# Patient Record
Sex: Male | Born: 1945
Health system: Southern US, Community
[De-identification: ages and names within clinical notes are randomized; demographics above are authoritative.]

## PROBLEM LIST (undated history)

## (undated) DIAGNOSIS — C801 Malignant (primary) neoplasm, unspecified: Secondary | ICD-10-CM

## (undated) DIAGNOSIS — T7840XA Allergy, unspecified, initial encounter: Secondary | ICD-10-CM

## (undated) DIAGNOSIS — R011 Cardiac murmur, unspecified: Secondary | ICD-10-CM

## (undated) DIAGNOSIS — H269 Unspecified cataract: Secondary | ICD-10-CM

## (undated) DIAGNOSIS — I00 Rheumatic fever without heart involvement: Secondary | ICD-10-CM

## (undated) DIAGNOSIS — Z22322 Carrier or suspected carrier of Methicillin resistant Staphylococcus aureus: Secondary | ICD-10-CM

## (undated) DIAGNOSIS — I10 Essential (primary) hypertension: Secondary | ICD-10-CM

## (undated) DIAGNOSIS — C4491 Basal cell carcinoma of skin, unspecified: Secondary | ICD-10-CM

## (undated) HISTORY — DX: Allergy, unspecified, initial encounter: T78.40XA

## (undated) HISTORY — DX: Essential (primary) hypertension: I10

## (undated) HISTORY — DX: Malignant (primary) neoplasm, unspecified: C80.1

## (undated) HISTORY — PX: COLONOSCOPY: SHX174

## (undated) HISTORY — DX: Basal cell carcinoma of skin, unspecified: C44.91

## (undated) HISTORY — DX: Unspecified cataract: H26.9

## (undated) HISTORY — PX: HERNIA REPAIR: SHX51

## (undated) HISTORY — DX: Cardiac murmur, unspecified: R01.1

## (undated) HISTORY — DX: Rheumatic fever without heart involvement: I00

## (undated) HISTORY — DX: Carrier or suspected carrier of methicillin resistant Staphylococcus aureus: Z22.322

## (undated) HISTORY — PX: SKIN CANCER EXCISION: SHX779

---

## 2005-03-20 DIAGNOSIS — Z22322 Carrier or suspected carrier of Methicillin resistant Staphylococcus aureus: Secondary | ICD-10-CM

## 2005-03-20 HISTORY — DX: Carrier or suspected carrier of methicillin resistant Staphylococcus aureus: Z22.322

## 2011-12-31 HISTORY — PX: ABDOMINAL HERNIA REPAIR: SHX539

## 2013-04-14 DIAGNOSIS — J3489 Other specified disorders of nose and nasal sinuses: Secondary | ICD-10-CM | POA: Diagnosis not present

## 2013-04-14 DIAGNOSIS — J309 Allergic rhinitis, unspecified: Secondary | ICD-10-CM | POA: Diagnosis not present

## 2013-04-24 DIAGNOSIS — J309 Allergic rhinitis, unspecified: Secondary | ICD-10-CM | POA: Diagnosis not present

## 2013-05-01 DIAGNOSIS — J3489 Other specified disorders of nose and nasal sinuses: Secondary | ICD-10-CM | POA: Diagnosis not present

## 2013-05-01 DIAGNOSIS — J309 Allergic rhinitis, unspecified: Secondary | ICD-10-CM | POA: Diagnosis not present

## 2013-05-16 DIAGNOSIS — J3089 Other allergic rhinitis: Secondary | ICD-10-CM | POA: Diagnosis not present

## 2013-05-16 DIAGNOSIS — J3081 Allergic rhinitis due to animal (cat) (dog) hair and dander: Secondary | ICD-10-CM | POA: Diagnosis not present

## 2013-05-16 DIAGNOSIS — J301 Allergic rhinitis due to pollen: Secondary | ICD-10-CM | POA: Diagnosis not present

## 2013-05-27 DIAGNOSIS — J301 Allergic rhinitis due to pollen: Secondary | ICD-10-CM | POA: Diagnosis not present

## 2013-05-27 DIAGNOSIS — J3089 Other allergic rhinitis: Secondary | ICD-10-CM | POA: Diagnosis not present

## 2013-05-27 DIAGNOSIS — J3081 Allergic rhinitis due to animal (cat) (dog) hair and dander: Secondary | ICD-10-CM | POA: Diagnosis not present

## 2013-06-03 DIAGNOSIS — J3081 Allergic rhinitis due to animal (cat) (dog) hair and dander: Secondary | ICD-10-CM | POA: Diagnosis not present

## 2013-06-03 DIAGNOSIS — J3089 Other allergic rhinitis: Secondary | ICD-10-CM | POA: Diagnosis not present

## 2013-06-03 DIAGNOSIS — J301 Allergic rhinitis due to pollen: Secondary | ICD-10-CM | POA: Diagnosis not present

## 2013-06-10 DIAGNOSIS — J301 Allergic rhinitis due to pollen: Secondary | ICD-10-CM | POA: Diagnosis not present

## 2013-06-10 DIAGNOSIS — J3081 Allergic rhinitis due to animal (cat) (dog) hair and dander: Secondary | ICD-10-CM | POA: Diagnosis not present

## 2013-06-10 DIAGNOSIS — J3089 Other allergic rhinitis: Secondary | ICD-10-CM | POA: Diagnosis not present

## 2013-06-17 DIAGNOSIS — J301 Allergic rhinitis due to pollen: Secondary | ICD-10-CM | POA: Diagnosis not present

## 2013-06-17 DIAGNOSIS — J3081 Allergic rhinitis due to animal (cat) (dog) hair and dander: Secondary | ICD-10-CM | POA: Diagnosis not present

## 2013-06-17 DIAGNOSIS — J3089 Other allergic rhinitis: Secondary | ICD-10-CM | POA: Diagnosis not present

## 2013-12-01 DIAGNOSIS — J309 Allergic rhinitis, unspecified: Secondary | ICD-10-CM | POA: Diagnosis not present

## 2013-12-23 DIAGNOSIS — J3081 Allergic rhinitis due to animal (cat) (dog) hair and dander: Secondary | ICD-10-CM | POA: Diagnosis not present

## 2013-12-23 DIAGNOSIS — J301 Allergic rhinitis due to pollen: Secondary | ICD-10-CM | POA: Diagnosis not present

## 2013-12-29 ENCOUNTER — Ambulatory Visit (INDEPENDENT_AMBULATORY_CARE_PROVIDER_SITE_OTHER): Payer: Medicare Other | Admitting: Family

## 2013-12-29 ENCOUNTER — Encounter: Payer: Self-pay | Admitting: Family

## 2013-12-29 VITALS — BP 136/84 | HR 80 | Temp 98.2°F | Resp 16 | Ht 70.0 in | Wt 171.8 lb

## 2013-12-29 DIAGNOSIS — Z23 Encounter for immunization: Secondary | ICD-10-CM

## 2013-12-29 DIAGNOSIS — I10 Essential (primary) hypertension: Secondary | ICD-10-CM | POA: Diagnosis not present

## 2013-12-29 DIAGNOSIS — Z8619 Personal history of other infectious and parasitic diseases: Secondary | ICD-10-CM

## 2013-12-29 DIAGNOSIS — Z91048 Other nonmedicinal substance allergy status: Secondary | ICD-10-CM | POA: Diagnosis not present

## 2013-12-29 DIAGNOSIS — Z8679 Personal history of other diseases of the circulatory system: Secondary | ICD-10-CM

## 2013-12-29 DIAGNOSIS — Z9109 Other allergy status, other than to drugs and biological substances: Secondary | ICD-10-CM

## 2013-12-29 DIAGNOSIS — J301 Allergic rhinitis due to pollen: Secondary | ICD-10-CM | POA: Diagnosis not present

## 2013-12-29 DIAGNOSIS — J3089 Other allergic rhinitis: Secondary | ICD-10-CM | POA: Diagnosis not present

## 2013-12-29 DIAGNOSIS — J3081 Allergic rhinitis due to animal (cat) (dog) hair and dander: Secondary | ICD-10-CM | POA: Diagnosis not present

## 2013-12-29 MED ORDER — LISINOPRIL 10 MG PO TABS: 10.0000 mg | ORAL_TABLET | Freq: Every day | ORAL | Status: DC

## 2013-12-29 NOTE — Patient Instructions (Signed)
Please complete your lab work prior to leaving. Schedule a medicare wellness visit at the front desk. Welcome to [facility]!

## 2013-12-29 NOTE — Progress Notes (Signed)
Pre visit review using our clinic review tool, if applicable. No additional management support is needed unless otherwise documented below in the visit note. 

## 2013-12-29 NOTE — Progress Notes (Signed)
   Subjective:    Patient ID: Gregory Brennan, male    DOB: 12-28-1945, 68 y.o.   MRN: [id number]  HPI  Gregory Brennan is a 68 yr old male who presents today to establish care.  HTN- Maintained on lisinopril. Reports that he started on BP meds in 08/2005.    Rheumatic fever as a child- reports that he had at age 61 1/4.  Reports that he had a murmur as a child, but not as an adult.  Denies SOB or swelling. Was an athlete through school, bikes/walks regularly.    Allergies- reports that his allergies were year round in [place]. Started to see an allergist when he was in [place].  He is established with an allergist- Dr. Donneta Romberg.   Review of Systems  Constitutional: Negative for unexpected weight change.  HENT: Positive for rhinorrhea.   Respiratory: Negative for cough and shortness of breath.   Cardiovascular: Negative for chest pain and leg swelling.  Gastrointestinal: Negative for nausea, diarrhea and constipation.  Musculoskeletal:       Occasional knee pain  Neurological: Negative for headaches.  Hematological: Negative for adenopathy.  Psychiatric/Behavioral:       Denies depression/anxiety       Past Medical History  Diagnosis Date  . Allergy   . Hypertension     History   Social History  . Marital Status: Married    Spouse Name: N/A    Number of Children: N/A  . Years of Education: N/A   Occupational History  . Not on file.   Social History Main Topics  . Smoking status: Never Smoker   . Smokeless tobacco: Never Used  . Alcohol Use: No  . Drug Use: Not on file  . Sexual Activity: Not on file   Other Topics Concern  . 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 Abdominal hernia repair       Family History  Problem Relation Age of Onset  . Emphysema Mother   . Hypertension Father     Allergies  Allergen Reactions  . Penicillins Rash    No current outpatient prescriptions on file prior to visit.   No current facility-administered medications on file prior to visit.    BP 136/84  Pulse 80  Temp(Src) 98.2 F (36.8 C) (Oral)  Resp 16  Ht 5\' 10"  (1.778 m)  Wt 171 lb 12.8 oz (77.928 kg)  BMI 24.65 kg/m2  SpO2 100%    Objective:   Physical Exam  Constitutional: He is oriented to person, place, and time. He appears well-developed and well-nourished. No distress.  HENT:  Head: Normocephalic and atraumatic.  Cardiovascular: Normal rate and regular rhythm.   No murmur heard. Pulmonary/Chest: Effort normal and breath sounds normal. No respiratory distress. He has no wheezes. He has no rales. He exhibits no tenderness.  Neurological: He is alert and oriented to person, place, and time.  Skin: Skin is warm and dry.  Psychiatric: He has a normal mood and affect. His behavior is normal. Judgment and thought content normal.          Assessment & Plan:

## 2013-12-30 ENCOUNTER — Telehealth: Payer: Self-pay | Admitting: Family

## 2013-12-30 DIAGNOSIS — Z9109 Other allergy status, other than to drugs and biological substances: Secondary | ICD-10-CM | POA: Insufficient documentation

## 2013-12-30 DIAGNOSIS — Z8679 Personal history of other diseases of the circulatory system: Secondary | ICD-10-CM | POA: Insufficient documentation

## 2013-12-30 DIAGNOSIS — E871 Hypo-osmolality and hyponatremia: Secondary | ICD-10-CM

## 2013-12-30 DIAGNOSIS — I10 Essential (primary) hypertension: Secondary | ICD-10-CM | POA: Insufficient documentation

## 2013-12-30 LAB — BASIC METABOLIC PANEL
BUN: 9 mg/dL (ref 6–23)
CO2: 27 meq/L (ref 19–32)
CREATININE: 0.9 mg/dL (ref 0.4–1.5)
Calcium: 8.9 mg/dL (ref 8.4–10.5)
Chloride: 95 mEq/L — ABNORMAL LOW (ref 96–112)
GFR: 86.26 mL/min (ref >60.00)
Glucose, Bld: 108 mg/dL — ABNORMAL HIGH (ref 70–99)
Potassium: 3.6 mEq/L (ref 3.5–5.1)
Sodium: 130 mEq/L — ABNORMAL LOW (ref 135–145)

## 2013-12-30 NOTE — Assessment & Plan Note (Signed)
He is being treated by allergist- Dr. Donneta Romberg

## 2013-12-30 NOTE — Telephone Encounter (Signed)
Notified pt and scheduled lab appt for 01/07/14 at 11am.  Future orders signed.

## 2013-12-30 NOTE — Assessment & Plan Note (Signed)
No murmur, no cardiac complaints.

## 2013-12-30 NOTE — Telephone Encounter (Signed)
Sodium is low, and chloride is low. Please return to lab in 1 week so we can repeat and check some other pertinent labs.

## 2013-12-30 NOTE — Assessment & Plan Note (Signed)
BP Readings from Last 3 Encounters:  12/29/13 136/84   BP stable on lisinopril, obtain follow up bmet.

## 2014-01-06 DIAGNOSIS — J301 Allergic rhinitis due to pollen: Secondary | ICD-10-CM | POA: Diagnosis not present

## 2014-01-06 DIAGNOSIS — J3089 Other allergic rhinitis: Secondary | ICD-10-CM | POA: Diagnosis not present

## 2014-01-06 DIAGNOSIS — J3081 Allergic rhinitis due to animal (cat) (dog) hair and dander: Secondary | ICD-10-CM | POA: Diagnosis not present

## 2014-01-07 ENCOUNTER — Other Ambulatory Visit (INDEPENDENT_AMBULATORY_CARE_PROVIDER_SITE_OTHER): Payer: Medicare Other

## 2014-01-07 DIAGNOSIS — E871 Hypo-osmolality and hyponatremia: Secondary | ICD-10-CM | POA: Diagnosis not present

## 2014-01-07 LAB — BASIC METABOLIC PANEL
BUN: 9 mg/dL (ref 6–23)
CALCIUM: 8.8 mg/dL (ref 8.4–10.5)
CO2: 30 mEq/L (ref 19–32)
CREATININE: 1 mg/dL (ref 0.4–1.5)
Chloride: 97 mEq/L (ref 96–112)
GFR: 76.66 mL/min (ref >60.00)
Glucose, Bld: 97 mg/dL (ref 70–99)
Potassium: 3.8 mEq/L (ref 3.5–5.1)
Sodium: 129 mEq/L — ABNORMAL LOW (ref 135–145)

## 2014-01-08 ENCOUNTER — Telehealth: Payer: Self-pay | Admitting: Family

## 2014-01-08 DIAGNOSIS — E871 Hypo-osmolality and hyponatremia: Secondary | ICD-10-CM

## 2014-01-08 LAB — OSMOLALITY, URINE: Osmolality, Ur: 196 mOsm/kg — ABNORMAL LOW (ref 390–1090)

## 2014-01-08 LAB — OSMOLALITY: Osmolality: 276 mOsm/kg (ref 275–300)

## 2014-01-08 LAB — SODIUM, URINE, RANDOM: SODIUM UR: 41 meq/L

## 2014-01-08 NOTE — Telephone Encounter (Signed)
Please let pt know that I reviewed his lab work.  His sodium is still low.  How many ounces of water/fluid does he drink a day?  I would like to refer him to see a specialist to further evaluate his low sodium.

## 2014-01-09 NOTE — Telephone Encounter (Signed)
Left message for pt to call with response to below recommendations.

## 2014-01-09 NOTE — Telephone Encounter (Signed)
Spoke with pt. He states he drinks 64oz of water and 24-48 oz of coffee daily. Total 88-110oz fluid daily.  Please advise.

## 2014-01-10 NOTE — Telephone Encounter (Signed)
i would recommend that he bring total fluids down 64 oz a day.

## 2014-01-12 NOTE — Telephone Encounter (Signed)
Notified pt and he voices understanding. Referral signed.

## 2014-01-14 DIAGNOSIS — J3089 Other allergic rhinitis: Secondary | ICD-10-CM | POA: Diagnosis not present

## 2014-01-14 DIAGNOSIS — J3081 Allergic rhinitis due to animal (cat) (dog) hair and dander: Secondary | ICD-10-CM | POA: Diagnosis not present

## 2014-01-14 DIAGNOSIS — J301 Allergic rhinitis due to pollen: Secondary | ICD-10-CM | POA: Diagnosis not present

## 2014-01-16 ENCOUNTER — Encounter: Payer: Self-pay | Admitting: Endocrinology

## 2014-01-16 ENCOUNTER — Ambulatory Visit (INDEPENDENT_AMBULATORY_CARE_PROVIDER_SITE_OTHER): Payer: Medicare Other | Admitting: Endocrinology

## 2014-01-16 VITALS — BP 154/86 | HR 84 | Temp 98.0°F | Resp 14 | Ht 70.0 in | Wt 171.8 lb

## 2014-01-16 DIAGNOSIS — E871 Hypo-osmolality and hyponatremia: Secondary | ICD-10-CM

## 2014-01-16 LAB — COMPREHENSIVE METABOLIC PANEL
ALT: 24 U/L (ref 0–53)
AST: 21 U/L (ref 0–37)
Albumin: 3.9 g/dL (ref 3.5–5.2)
Alkaline Phosphatase: 85 U/L (ref 39–117)
BILIRUBIN TOTAL: 0.7 mg/dL (ref 0.2–1.2)
BUN: 13 mg/dL (ref 6–23)
CO2: 30 mEq/L (ref 19–32)
CREATININE: 1.2 mg/dL (ref 0.4–1.5)
Calcium: 9.1 mg/dL (ref 8.4–10.5)
Chloride: 96 mEq/L (ref 96–112)
GFR: 67.5 mL/min (ref >60.00)
Glucose, Bld: 101 mg/dL — ABNORMAL HIGH (ref 70–99)
Potassium: 3.9 mEq/L (ref 3.5–5.1)
Sodium: 133 mEq/L — ABNORMAL LOW (ref 135–145)
Total Protein: 7.2 g/dL (ref 6.0–8.3)

## 2014-01-16 LAB — T4, FREE: FREE T4: 0.93 ng/dL (ref 0.60–1.60)

## 2014-01-16 LAB — TSH: TSH: 0.6 u[IU]/mL (ref 0.35–4.50)

## 2014-01-16 NOTE — Progress Notes (Signed)
Patient ID: Gregory Brennan, male   DOB: 05/21/1945, 68 y.o.   MRN: [id number]   Chief complaint: Low sodium  History of Present Illness:  He had a new patient visit with his PCP on 12/29/13 and because of his hypertension he had a basic metabolic panel checked He was found to have a sodium of 130 and on repeat testing it was 129 He does not think that his primary care physician in [place] where he moved from had mentioned low sodium before on his lab testing. However no records are available  Recently has had no symptoms of fatigue, malaise, nausea, decreased appetite or any episodes of diarrhea or acute illness No recent weight change  According to the notes from his PCP he had been drinking between 88-110 ounces of fluid in a day He has been doing this for quite some time, probably 2-3 years This is partly because of his working in a hot environment before he moved here Also he was told by his friends that he needed to be drinking a lot of water, he is somewhat concerned about wanting to lose weight Today he states that he has been drinking 4-6 ounces of water bottles per day as also about 4-6 cups of coffee  After discussion with his obesity about 4 days ago he has started cutting back on his fluids as advised. He has been told to limit his total fluid intake in a day to 8 cups of fluids and has been trying to do this    Past Medical History  Diagnosis Date  . Allergy   . Hypertension     Past Surgical History  Procedure Laterality Date  . Abdominal hernia repair      Family History  Problem Relation Age of Onset  . Emphysema Mother   . Hypertension Father     Social History:  reports that he has never smoked. He has never used smokeless tobacco. He reports that he does not drink alcohol. His drug history is not on file.  Allergies:  Allergies  Allergen Reactions  . Penicillins Rash      Medication List       This list is accurate as of: 01/16/14  2:30 PM.   Always use your most recent med list.               lisinopril 10 MG tablet  Commonly known as:  PRINIVIL,ZESTRIL  Take 1 tablet (10 mg total) by mouth daily.       Review of Systems  Constitutional: Negative for weight loss and malaise/fatigue.  HENT: Negative for congestion.        Only with allergies  Respiratory: Negative for cough and sputum production.   Cardiovascular:       Hypertension treated since 2008  Gastrointestinal: Negative for nausea.  Neurological: Negative for dizziness.  Endo/Heme/Allergies: Positive for polydipsia.       Drinking water and coffee   Chest Xray was last done 2-3 years ago       No unusual headaches.      Thyroid:  No cold or heat intolerance, unusual fatigue     No swelling of feet.     No abdominal pain.  Bowel habits:  No change.       No frequency of urination or excessive nocturia  LABS:  No visits with results within 1 Week(s) from this visit. Latest known visit with results is:  Appointment on 01/07/2014  Component Date Value Ref Range Status  .  Osmolality 01/07/2014 276  275 - 300 mOsm/kg Final  . Sodium, Ur 01/07/2014 41   Final  . Osmolality, Ur 01/07/2014 196* 390 - 1090 mOsm/kg Final  . Sodium 01/07/2014 129* 135 - 145 mEq/L Final  . Potassium 01/07/2014 3.8  3.5 - 5.1 mEq/L Final  . Chloride 01/07/2014 97  96 - 112 mEq/L Final  . CO2 01/07/2014 30  19 - 32 mEq/L Final  . Glucose, Bld 01/07/2014 97  70 - 99 mg/dL Final  . BUN 01/07/2014 9  6 - 23 mg/dL Final  . Creatinine, Ser 01/07/2014 1.0  0.4 - 1.5 mg/dL Final  . Calcium 01/07/2014 8.8  8.4 - 10.5 mg/dL Final  . GFR 01/07/2014 76.66  >60.00 mL/min Final    .    PHYSICAL EXAM:  BP 154/86  Pulse 84  Temp(Src) 98 F (36.7 C)  Resp 14  Ht 5\' 10"  (1.778 m)  Wt 171 lb 12.8 oz (77.928 kg)  BMI 24.65 kg/m2  SpO2 98%  GENERAL: average built and nourished, pleasant and cooperative  No pallor, clubbing, lymphadenopathy or edema.  Skin:  no rash or  pigmentation.  EYES:  Externally normal.  Fundii:  normal discs and vessels.  ENT: Oral mucosa and tongue normal.  THYROID:  Not palpable.  HEART:  Normal  S1 and S2; no murmur or click.  CHEST:  Normal shape.  Lungs: percussion normal and bilaterally symmetrical Vescicular breath sounds heard equally.  No crepitations/ wheeze.  ABDOMEN:  No distention.  Liver and spleen not palpable.  No other mass or tenderness.  NEUROLOGICAL: .Reflexes are bilaterally normal at biceps.  JOINTS:  Normal.   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 01/16/2014, 2:30 PM   Addendum: Labs show improving sodium level of 133, thyroid levels normal  He will continue to reduce his fluid intake and follow-up with PCP periodically for repeat sodium  Office Visit on 01/16/2014  Component Date Value Ref Range Status  . Free T4 01/16/2014 0.93  0.60 - 1.60 ng/dL Final  . TSH 01/16/2014 0.60  0.35 - 4.50 uIU/mL Final  . Sodium 01/16/2014 133* 135 - 145 mEq/L Final  . Potassium 01/16/2014 3.9  3.5 - 5.1  mEq/L Final  . Chloride 01/16/2014 96  96 - 112 mEq/L Final  . CO2 01/16/2014 30  19 - 32 mEq/L Final  . Glucose, Bld 01/16/2014 101* 70 - 99 mg/dL Final  . BUN 01/16/2014 13  6 - 23 mg/dL Final  . Creatinine, Ser 01/16/2014 1.2  0.4 - 1.5  mg/dL Final  . Total Bilirubin 01/16/2014 0.7  0.2 - 1.2 mg/dL Final  . Alkaline Phosphatase 01/16/2014 85  39 - 117 U/L Final  . AST 01/16/2014 21  0 - 37 U/L Final  . ALT 01/16/2014 24  0 - 53 U/L Final  . Total Protein 01/16/2014 7.2  6.0 - 8.3 g/dL Final  . Albumin 01/16/2014 3.9  3.5 - 5.2 g/dL Final  . Calcium 01/16/2014 9.1  8.4 - 10.5 mg/dL Final  . GFR 01/16/2014 67.50  >60.00 mL/min Final

## 2014-01-19 NOTE — Progress Notes (Signed)
Quick Note:  Sodium is significantly better, continue reducing fluid intake as discussed, follow-up with primary care, thyroid okay ______

## 2014-01-20 DIAGNOSIS — J3089 Other allergic rhinitis: Secondary | ICD-10-CM | POA: Diagnosis not present

## 2014-01-20 DIAGNOSIS — J3081 Allergic rhinitis due to animal (cat) (dog) hair and dander: Secondary | ICD-10-CM | POA: Diagnosis not present

## 2014-01-20 DIAGNOSIS — J301 Allergic rhinitis due to pollen: Secondary | ICD-10-CM | POA: Diagnosis not present

## 2014-01-26 ENCOUNTER — Encounter: Payer: Self-pay | Admitting: Family

## 2014-01-26 ENCOUNTER — Ambulatory Visit (INDEPENDENT_AMBULATORY_CARE_PROVIDER_SITE_OTHER): Payer: Medicare Other | Admitting: Family

## 2014-01-26 VITALS — BP 150/92 | HR 62 | Temp 97.8°F | Resp 18 | Ht 70.0 in | Wt 173.6 lb

## 2014-01-26 DIAGNOSIS — I10 Essential (primary) hypertension: Secondary | ICD-10-CM | POA: Diagnosis not present

## 2014-01-26 DIAGNOSIS — Z0000 Encounter for general adult medical examination without abnormal findings: Secondary | ICD-10-CM | POA: Diagnosis not present

## 2014-01-26 DIAGNOSIS — Z23 Encounter for immunization: Secondary | ICD-10-CM | POA: Diagnosis not present

## 2014-01-26 MED ORDER — LISINOPRIL 10 MG PO TABS: 20.0000 mg | ORAL_TABLET | Freq: Every day | ORAL | Status: DC

## 2014-01-26 NOTE — Progress Notes (Signed)
Subjective:    Patient ID: Gregory Brennan, male    DOB: [birth year], 68 y.o.   MRN: [id number]  HPI    Review of Systems     Objective:   Physical Exam        Assessment & Plan:   Subjective:    Gregory Brennan is a 68 y.o. male who presents for Medicare Initial preventive examination.  Preventive Screening-Counseling & Management  Tobacco History  Smoking status  . Never Smoker   Smokeless tobacco  . Never Used    Problems Prior to Visit 1. HTN-  BP Readings from Last 3 Encounters:  01/26/14 150/92  01/16/14 154/86  12/29/13 136/84   2. Hyponatremia- Was improved by fluid restriction to 64 oz a day. Saw Dr.Kumar.     Current Problems (verified) Patient Active Problem List   Diagnosis Date Noted  . HTN (hypertension) 12/30/2013  . History of rheumatic fever 12/30/2013  . Environmental allergies 12/30/2013    Medications Prior to Visit Current Outpatient Prescriptions on File Prior to Visit  Medication Sig Dispense Refill  . lisinopril (PRINIVIL,ZESTRIL) 10 MG tablet Take 1 tablet (10 mg total) by mouth daily. 90 tablet 1   No current facility-administered medications on file prior to visit.    Current Medications (verified) Current Outpatient Prescriptions  Medication Sig Dispense Refill  . lisinopril (PRINIVIL,ZESTRIL) 10 MG tablet Take 1 tablet (10 mg total) by mouth daily. 90 tablet 1   No current facility-administered medications for this visit.     Allergies (verified) Penicillins   PAST HISTORY  Family History Family History  Problem Relation Age of Onset  . Emphysema Mother   . Hypertension Father     Social History History  Substance Use Topics  . Smoking status: Never Smoker   . Smokeless tobacco: Never Used  . Alcohol Use: No    Are there smokers in your home (other than you)?   No  Risk Factors Current exercise habits: weight lifting and cardio  Dietary issues discussed: working on healthy diet   Cardiac risk  factors: advanced age (older than 66 for men, 61 for women). HTN  Depression Screen (Note: if answer to either of the following is "Yes", a more complete depression screening is indicated)   Q1: Over the past two weeks, have you felt down, depressed or hopeless? No  Q2: Over the past two weeks, have you felt little interest or pleasure in doing things? No  Have you lost interest or pleasure in daily life? No  Do you often feel hopeless? No  Do you cry easily over simple problems? No  Activities of Daily Living In your present state of health, do you have any difficulty performing the following activities?:  Driving? No Managing money?  No Feeding yourself? No Getting from bed to chair? No Climbing a flight of stairs? No Preparing food and eating?: No Bathing or showering? No Getting dressed: No Getting to the toilet? No Using the toilet:No Moving around from place to place: No In the past year have you fallen or had a near fall?:No   Are you sexually active?  Yes  Do you have more than one partner?  No  Hearing Difficulties: No Do you often ask people to speak up or repeat themselves? No Do you experience ringing or noises in your ears? No Do you have difficulty understanding soft or whispered voices? No   Do you feel that you have a problem with memory? No  Do you often  misplace items? No  Do you feel safe at home?  No  Cognitive Testing  Alert? Yes  Normal Appearance?Yes  Oriented to person? Yes  Place? Yes   Time? Yes  Recall of three objects?  Yes  Can perform simple calculations? Yes  Displays appropriate judgment?Yes  Can read the correct time from a watch face?Yes   Advanced Directives have been discussed with the patient? Yes   List the Names of Other Physician/Practitioners you currently use: 1.    Indicate any recent Medical Services you may have received from other than [facility] providers in the past year (date may be approximate).  Immunization History    Administered Date(s) Administered  . Influenza,inj,Quad PF,36+ Mos 12/29/2013  . Tdap 03/20/2008    Screening Tests Health Maintenance  Topic Date Due  . TETANUS/TDAP  06/14/1966  . ZOSTAVAX  06/14/2007  . PNEUMOCOCCAL POLYSACCHARIDE VACCINE AGE 22 AND OVER  06/13/2012  . INFLUENZA VACCINE  10/19/2014  . COLONOSCOPY  03/21/2019    All answers were reviewed with the patient and necessary referrals were made:  O'SULLIVAN,Nikkie Liming S., NP   01/26/2014   History reviewed: allergies, current medications, past family history, past medical history, past social history, past surgical history and problem list  Review of Systems Pertinent items are noted in HPI.    Objective:     Vision by Snellen chart: right OIN:OMVEHMC declines measurement, left NOB:SJGGEZM declines measurement Blood pressure 150/92, pulse 62, temperature 97.8 F (36.6 C), temperature source Oral, resp. rate 18, height 5\' 10"  (1.778 m), weight 173 lb 9.6 oz (78.744 kg), SpO2 99 %. Body mass index is 24.91 kg/(m^2).  Physical Exam  Constitutional: He is oriented to person, place, and time. He appears well-developed and well-nourished. No distress.  HENT:  Head: Normocephalic and atraumatic.  Right Ear: Tympanic membrane and ear canal normal.  Left Ear: Tympanic membrane and ear canal normal.  Mouth/Throat: Oropharynx is clear and moist.  Eyes: Pupils are equal, round, and reactive to light. No scleral icterus.  Neck: Normal range of motion. No thyromegaly present.  Cardiovascular: Normal rate and regular rhythm.   No murmur heard. Pulmonary/Chest: Effort normal and breath sounds normal. No respiratory distress. He has no wheezes. He has no rales. He exhibits no tenderness.  Abdominal: Soft. Bowel sounds are normal. He exhibits no distension and no mass. There is no tenderness. There is no rebound and no guarding.  Musculoskeletal: He exhibits no edema.  Lymphadenopathy:    He has no cervical adenopathy.   Neurological: He is alert and oriented to person, place, and time. He has normal reflexes. He exhibits normal muscle tone. Coordination normal.  Skin: Skin is warm and dry.  Psychiatric: He has a normal mood and affect. His behavior is normal. Judgment and thought content normal.          Assessment & Plan:         Assessment:           Plan:     During the course of the visit the patient was educated and counseled about appropriate screening and preventive services including:    Pneumococcal vaccine   shingles vaccine Declines PSA.    Diet review for nutrition referral? Yes ____  Not Indicated _x___   Patient Instructions (the written plan) was given to the patient.  Medicare Attestation I have personally reviewed: The patient's medical and social history Their use of alcohol, tobacco or illicit drugs Their current medications and supplements The patient's functional  ability including ADLs,fall risks, home safety risks, cognitive, and hearing and visual impairment Diet and physical activities Evidence for depression or mood disorders  The patient's weight, height, BMI, and visual acuity have been recorded in the chart.  I have made referrals, counseling, and provided education to the patient based on review of the above and I have provided the patient with a written personalized care plan for preventive services.     O'SULLIVAN,Lorrena Goranson S., NP   01/26/2014

## 2014-01-26 NOTE — Assessment & Plan Note (Signed)
Uncontrolled. Will increase to 20mg  of lisinopril once daily.

## 2014-01-26 NOTE — Patient Instructions (Signed)
Increase lisinopril from 10 mg to 20 mg once daily.   Follow up in 2 weeks.

## 2014-01-26 NOTE — Progress Notes (Signed)
Pre visit review using our clinic review tool, if applicable. No additional management support is needed unless otherwise documented below in the visit note. 

## 2014-01-29 DIAGNOSIS — J3081 Allergic rhinitis due to animal (cat) (dog) hair and dander: Secondary | ICD-10-CM | POA: Diagnosis not present

## 2014-01-29 DIAGNOSIS — J301 Allergic rhinitis due to pollen: Secondary | ICD-10-CM | POA: Diagnosis not present

## 2014-01-29 DIAGNOSIS — J3089 Other allergic rhinitis: Secondary | ICD-10-CM | POA: Diagnosis not present

## 2014-02-04 DIAGNOSIS — J3089 Other allergic rhinitis: Secondary | ICD-10-CM | POA: Diagnosis not present

## 2014-02-04 DIAGNOSIS — J3081 Allergic rhinitis due to animal (cat) (dog) hair and dander: Secondary | ICD-10-CM | POA: Diagnosis not present

## 2014-02-04 DIAGNOSIS — J301 Allergic rhinitis due to pollen: Secondary | ICD-10-CM | POA: Diagnosis not present

## 2014-02-09 ENCOUNTER — Encounter: Payer: Self-pay | Admitting: Family

## 2014-02-09 ENCOUNTER — Ambulatory Visit (INDEPENDENT_AMBULATORY_CARE_PROVIDER_SITE_OTHER): Payer: Medicare Other | Admitting: Family

## 2014-02-09 VITALS — BP 150/90 | HR 56 | Temp 97.4°F | Resp 16 | Ht 70.0 in | Wt 174.8 lb

## 2014-02-09 DIAGNOSIS — I10 Essential (primary) hypertension: Secondary | ICD-10-CM

## 2014-02-09 MED ORDER — LISINOPRIL-HYDROCHLOROTHIAZIDE 20-25 MG PO TABS: 1.0000 | ORAL_TABLET | Freq: Every day | ORAL | Status: DC

## 2014-02-09 NOTE — Progress Notes (Signed)
   Subjective:    Patient ID: Gregory Brennan, male    DOB: April 14, 1945, 68 y.o.   MRN: [id number]  HPI  Gregory Brennan is a 68 yr old male who presents today for follow up of his HTN.  Last visit BP was elevated and his lisinopril was increased from 10mg  to 20mg .  BP Readings from Last 3 Encounters:  02/09/14 150/90  01/26/14 150/92  01/16/14 154/86      Review of Systems See HPI  Past Medical History  Diagnosis Date  . Allergy   . Hypertension     History   Social History  . Marital Status: Married    Spouse Name: N/A    Number of Children: N/A  . Years of Education: N/A   Occupational History  . Not on file.   Social History Main Topics  . Smoking status: Never Smoker   . Smokeless tobacco: Never Used  . Alcohol Use: No  . Drug Use: Not on file  . Sexual Activity: Not on file   Other Topics Concern  . 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 Abdominal hernia repair      Family History  Problem Relation Age of Onset  . Emphysema Mother   . Hypertension Father     Allergies  Allergen Reactions  . Penicillins Rash    No current outpatient prescriptions on file prior to visit.   No current facility-administered medications on file prior to visit.    BP 150/90 mmHg  Pulse 56  Temp(Src) 97.4 F (36.3 C) (Oral)  Resp 16  Ht 5\' 10"  (1.778 m)  Wt 174 lb 12.8 oz (79.289 kg)  BMI 25.08 kg/m2  SpO2 99%       Objective:   Physical Exam  Constitutional: He is oriented to person, place, and time. He appears well-developed and well-nourished. No distress.  Eyes: No scleral icterus.  Cardiovascular: Normal rate and regular rhythm.   No murmur heard. Pulmonary/Chest:  Effort normal and breath sounds normal. No respiratory distress. He has no wheezes. He has no rales. He exhibits no tenderness.  Musculoskeletal: He exhibits no edema.  Neurological: He is alert and oriented to person, place, and time.  Psychiatric: He has a normal mood and affect. His behavior is normal. Judgment and thought content normal.          Assessment & Plan:

## 2014-02-09 NOTE — Progress Notes (Signed)
Pre visit review using our clinic review tool, if applicable. No additional management support is needed unless otherwise documented below in the visit note. 

## 2014-02-09 NOTE — Assessment & Plan Note (Signed)
BP remains uncontrolled despite increase in lisinopril. Will change to lisinopril hctz, obtain follow up bmet.

## 2014-02-09 NOTE — Patient Instructions (Signed)
Change lisinopril to lisinopril hctz. Complete lab work prior to leaving. Follow up in 3 weeks.

## 2014-02-10 ENCOUNTER — Encounter: Payer: Self-pay | Admitting: Family

## 2014-02-10 DIAGNOSIS — J301 Allergic rhinitis due to pollen: Secondary | ICD-10-CM | POA: Diagnosis not present

## 2014-02-10 DIAGNOSIS — J3089 Other allergic rhinitis: Secondary | ICD-10-CM | POA: Diagnosis not present

## 2014-02-10 LAB — BASIC METABOLIC PANEL
BUN: 12 mg/dL (ref 6–23)
CO2: 25 mEq/L (ref 19–32)
Calcium: 9.1 mg/dL (ref 8.4–10.5)
Chloride: 99 mEq/L (ref 96–112)
Creatinine, Ser: 0.9 mg/dL (ref 0.4–1.5)
GFR: 85.17 mL/min (ref >60.00)
GLUCOSE: 107 mg/dL — AB (ref 70–99)
Potassium: 4.1 mEq/L (ref 3.5–5.1)
Sodium: 134 mEq/L — ABNORMAL LOW (ref 135–145)

## 2014-02-16 ENCOUNTER — Ambulatory Visit: Payer: Medicare Other | Admitting: Family

## 2014-02-17 DIAGNOSIS — J301 Allergic rhinitis due to pollen: Secondary | ICD-10-CM | POA: Diagnosis not present

## 2014-02-17 DIAGNOSIS — J3089 Other allergic rhinitis: Secondary | ICD-10-CM | POA: Diagnosis not present

## 2014-02-17 DIAGNOSIS — J3081 Allergic rhinitis due to animal (cat) (dog) hair and dander: Secondary | ICD-10-CM | POA: Diagnosis not present

## 2014-02-24 ENCOUNTER — Encounter: Payer: Self-pay | Admitting: Medical

## 2014-02-24 ENCOUNTER — Ambulatory Visit (INDEPENDENT_AMBULATORY_CARE_PROVIDER_SITE_OTHER): Payer: Medicare Other | Admitting: Medical

## 2014-02-24 VITALS — BP 149/94 | HR 83 | Temp 98.2°F | Ht 70.0 in | Wt 173.4 lb

## 2014-02-24 DIAGNOSIS — J0100 Acute maxillary sinusitis, unspecified: Secondary | ICD-10-CM

## 2014-02-24 DIAGNOSIS — J322 Chronic ethmoidal sinusitis: Secondary | ICD-10-CM | POA: Insufficient documentation

## 2014-02-24 MED ORDER — BENZONATATE 200 MG PO CAPS: 200.0000 mg | ORAL_CAPSULE | Freq: Three times a day (TID) | ORAL | Status: DC | PRN

## 2014-02-24 MED ORDER — AZITHROMYCIN 250 MG PO TABS: ORAL_TABLET | ORAL | Status: DC

## 2014-02-24 NOTE — Progress Notes (Signed)
Pre visit review using our clinic review tool, if applicable. No additional management support is needed unless otherwise documented below in the visit note. 

## 2014-02-24 NOTE — Assessment & Plan Note (Signed)
Your appear to have a sinus infection. I am prescribing antibiotic azithromycin  for the infection. To help with the nasal congestion use flonase or nasocort otc. For your associated cough, I prescribed cough medicine benzonatate.   Rest, hydrate, tylenol for fever.

## 2014-02-24 NOTE — Progress Notes (Signed)
Subjective:    Patient ID: Gregory Brennan, male    DOB: [birth year], 68 y.o.   MRN: [id number]  HPI   Pt in today reporting cough, nasal congestion and runny nose for 4 Days. First 2 days mild but last 2 days he is getting more sinus pressure. Frontal and maxillary sinus pressure.  Pt has history of allergies and in [place] started allergy injections. He was doing well until he visited [place] and then visited family members.  Associated symptoms( below yes or no)  Fever-no Chills-no Chest congestion-no Sneezing- some Itching eyes-no Sore throat- mild Post-nasal drainage-yes Wheezing-no Purulent nasal drainage- yellow/brown discharge Ozan    Past Medical History  Diagnosis Date  . Allergy   . Hypertension     History   Social History  . Marital Status: Married    Spouse Name: N/A    Number of Children: N/A  . Years of Education: N/A   Occupational History  . Not on file.   Social History Main Topics  . Smoking status: Never Smoker   . Smokeless tobacco: Never Used  . Alcohol Use: No  . Drug Use: Not on file  . Sexual Activity: Not on file   Other Topics Concern  . 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 Abdominal hernia repair      Family History  Problem Relation Age of Onset  . Emphysema Mother   . Hypertension Father     Allergies  Allergen Reactions  . Penicillins Rash    Current Outpatient Prescriptions on File Prior to Visit  Medication Sig Dispense Refill  . lisinopril-hydrochlorothiazide (PRINZIDE,ZESTORETIC) 20-25 MG per tablet Take 1 tablet by mouth daily. 30 tablet 2   No current facility-administered medications on file prior to visit.      BP 149/94 mmHg  Pulse 83  Temp(Src) 98.2 F (36.8 C) (Oral)  Ht 5\' 10"  (1.778 m)  Wt 173 lb 6.4 oz (78.654 kg)  BMI 24.88 kg/m2  SpO2 99%       Review of Systems  Constitutional: Positive for fatigue. Negative for fever and chills.  HENT: Positive for congestion, postnasal drip, rhinorrhea, sinus pressure, sneezing and sore throat. Negative for ear pain and nosebleeds.   Respiratory: Positive for cough. Negative for choking, chest tightness, shortness of breath and wheezing.        Occasional cough.  Cardiovascular: Negative for chest pain and palpitations.  Musculoskeletal: Negative for back pain.  Neurological: Negative for dizziness, seizures, syncope, weakness, light-headedness, numbness and headaches.  Hematological: Negative for adenopathy. Does not bruise/bleed easily.       Objective:   Physical Exam   General  Mental Status - Alert. General Appearance - Well groomed. Not in acute distress. Very nasal congested.  Skin Rashes- No Rashes.  HEENT Head- Normal. Ear Auditory Canal - Left- Normal. Right - Normal.Tympanic Membrane- Left- Normal. Right- Normal. Eye Sclera/Conjunctiva- Left- Normal. Right- Normal. Nose & Sinuses Nasal Mucosa- Left-  Boggy and Congested. Right-  Boggy and  Congested. Faint b ilateral maxillary but frontal sinus pressure. Mouth & Throat Lips: Upper Lip- Normal: no dryness, cracking, pallor, cyanosis,  or vesicular eruption. Lower Lip-Normal: no dryness, cracking, pallor, cyanosis or vesicular eruption. Buccal Mucosa- Bilateral- No Aphthous ulcers. Oropharynx- No Discharge or Erythema. Tonsils: Characteristics- Bilateral- No Erythema or Congestion. Size/Enlargement- Bilateral- No enlargement. Discharge- bilateral-None.  Neck Neck- Supple. No Masses.   Chest and Lung Exam Auscultation: Breath Sounds:-Clear even and unlabored.  Cardiovascular Auscultation:Rythm- Regular, rate and rhythm. Murmurs & Other Heart  Sounds:Ausculatation of the heart reveal- No Murmurs.  Lymphatic Head & Neck General Head & Neck Lymphatics: Bilateral: Description- No Localized lymphadenopathy.         Assessment & Plan:

## 2014-02-24 NOTE — Patient Instructions (Addendum)
Your appear to have a sinus infection. I am prescribing antibiotic azithromycin  for the infection. To help with the nasal congestion use flonase or nasocort otc. For your associated cough, I prescribed cough medicine benzonatate.   Rest, hydrate, tylenol for fever.  Follow up in 7 days or as needed.  Above likely preceded by some allergy symptoms and he will call his allergist to see immunotherapy should be delayed.

## 2014-03-02 ENCOUNTER — Encounter: Payer: Self-pay | Admitting: Family

## 2014-03-02 ENCOUNTER — Ambulatory Visit (INDEPENDENT_AMBULATORY_CARE_PROVIDER_SITE_OTHER): Payer: Medicare Other | Admitting: Family

## 2014-03-02 VITALS — BP 122/80 | HR 86 | Temp 97.7°F | Resp 18 | Ht 70.0 in | Wt 173.8 lb

## 2014-03-02 DIAGNOSIS — J0100 Acute maxillary sinusitis, unspecified: Secondary | ICD-10-CM | POA: Diagnosis not present

## 2014-03-02 DIAGNOSIS — I10 Essential (primary) hypertension: Secondary | ICD-10-CM | POA: Diagnosis not present

## 2014-03-02 DIAGNOSIS — R739 Hyperglycemia, unspecified: Secondary | ICD-10-CM | POA: Diagnosis not present

## 2014-03-02 LAB — HEMOGLOBIN A1C: HEMOGLOBIN A1C: 5.7 % (ref 4.6–6.5)

## 2014-03-02 LAB — BASIC METABOLIC PANEL
BUN: 9 mg/dL (ref 6–23)
CALCIUM: 8.7 mg/dL (ref 8.4–10.5)
CO2: 27 mEq/L (ref 19–32)
CREATININE: 0.8 mg/dL (ref 0.4–1.5)
Chloride: 92 mEq/L — ABNORMAL LOW (ref 96–112)
GFR: 99.69 mL/min (ref >60.00)
Glucose, Bld: 123 mg/dL — ABNORMAL HIGH (ref 70–99)
Potassium: 3.6 mEq/L (ref 3.5–5.1)
Sodium: 125 mEq/L — ABNORMAL LOW (ref 135–145)

## 2014-03-02 MED ORDER — LISINOPRIL-HYDROCHLOROTHIAZIDE 20-25 MG PO TABS: 1.0000 | ORAL_TABLET | Freq: Every day | ORAL | Status: DC

## 2014-03-02 NOTE — Progress Notes (Signed)
Pre visit review using our clinic review tool, if applicable. No additional management support is needed unless otherwise documented below in the visit note. 

## 2014-03-02 NOTE — Assessment & Plan Note (Signed)
Resolved

## 2014-03-02 NOTE — Patient Instructions (Signed)
Please complete lab work prior to leaving.   Please schedule a follow up appointment in 3 months.  

## 2014-03-02 NOTE — Progress Notes (Signed)
   Subjective:    Patient ID: Gregory Brennan, male    DOB: November 13, 1945, 68 y.o.   MRN: [id number]  HPI   Patient is currently maintained on the following medications for blood pressure:  Lisinopril-hctz Patient reports good compliance with blood pressure medications. Patient denies chest pain, shortness of breath or swelling. Last 3 blood pressure readings in our office are as follows: BP Readings from Last 3 Encounters:  03/02/14 122/80  02/24/14 149/94  02/09/14 150/90    Saw Evern Core PA-c for sinusitis and was treated with Zpak, reports resolution of sinus symptoms.   Hyperglycemia- pt was noted to have mild elevation of glucose. This is new for him he tells me.   Review of Systems See HPI  Past Medical History  Diagnosis Date  . Allergy   . Hypertension     History   Social History  . Marital Status: Married    Spouse Name: N/A    Number of Children: N/A  . Years of Education: N/A   Occupational History  . Not on file.   Social History Main Topics  . Smoking status: Never Smoker   . Smokeless tobacco: Never Used  . Alcohol Use: No  . Drug Use: Not on file  . Sexual Activity: Not on file   Other Topics Concern  . 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 Abdominal hernia repair      Family History  Problem Relation Age of Onset  . Emphysema Mother   . Hypertension Father     Allergies  Allergen Reactions  . Penicillins Rash    No current outpatient prescriptions on file prior to visit.   No current facility-administered medications on file prior to visit.    BP 122/80 mmHg  Pulse 86  Temp(Src) 97.7 F (36.5 C) (Oral)  Resp 18  Ht 5\' 10"  (1.778 m)   Wt 173 lb 12.8 oz (78.835 kg)  BMI 24.94 kg/m2  SpO2 96%       Objective:   Physical Exam  Constitutional: He is oriented to person, place, and time. He appears well-developed and well-nourished. No distress.  Cardiovascular: Normal rate and regular rhythm.   No murmur heard. Pulmonary/Chest: Effort normal and breath sounds normal. No respiratory distress. He has no wheezes. He has no rales. He exhibits no tenderness.  Musculoskeletal: He exhibits no edema.  Neurological: He is alert and oriented to person, place, and time.  Psychiatric: He has a normal mood and affect. His behavior is normal. Judgment and thought content normal.          Assessment & Plan:

## 2014-03-02 NOTE — Assessment & Plan Note (Signed)
Improved, continue lisinopril/hctz, obtain follow up bmet.

## 2014-03-02 NOTE — Assessment & Plan Note (Signed)
Obtain a1c.  

## 2014-03-03 ENCOUNTER — Telehealth: Payer: Self-pay | Admitting: Family

## 2014-03-03 DIAGNOSIS — J301 Allergic rhinitis due to pollen: Secondary | ICD-10-CM | POA: Diagnosis not present

## 2014-03-03 DIAGNOSIS — J3089 Other allergic rhinitis: Secondary | ICD-10-CM | POA: Diagnosis not present

## 2014-03-03 DIAGNOSIS — J3081 Allergic rhinitis due to animal (cat) (dog) hair and dander: Secondary | ICD-10-CM | POA: Diagnosis not present

## 2014-03-03 MED ORDER — AMLODIPINE BESYLATE 5 MG PO TABS: 5.0000 mg | ORAL_TABLET | Freq: Every day | ORAL | Status: DC

## 2014-03-03 MED ORDER — LISINOPRIL 20 MG PO TABS: 20.0000 mg | ORAL_TABLET | Freq: Every day | ORAL | Status: DC

## 2014-03-03 NOTE — Telephone Encounter (Signed)
Pt noted to be significantly hyponatremic. Plan d/c lisinopril-hctz.  Instead, continue lisinopril 20mg  once daily, add amlodipine 5 mg once daily. Liberalize sodium. Limit fluid intake to 1.5 liter/day for the next one week.  Spoke to pt. Advised him of above. Please contact him and arrange a 1 week follow up apt with me.

## 2014-03-04 NOTE — Telephone Encounter (Signed)
Left detailed message informing patient to call our office and schedule 1 week follow up.

## 2014-03-09 ENCOUNTER — Encounter: Payer: Self-pay | Admitting: Family

## 2014-03-09 ENCOUNTER — Ambulatory Visit (INDEPENDENT_AMBULATORY_CARE_PROVIDER_SITE_OTHER): Payer: Medicare Other | Admitting: Family

## 2014-03-09 VITALS — BP 128/82 | HR 83 | Temp 97.6°F | Resp 16 | Ht 70.0 in | Wt 173.2 lb

## 2014-03-09 DIAGNOSIS — I10 Essential (primary) hypertension: Secondary | ICD-10-CM | POA: Diagnosis not present

## 2014-03-09 DIAGNOSIS — E871 Hypo-osmolality and hyponatremia: Secondary | ICD-10-CM | POA: Diagnosis not present

## 2014-03-09 NOTE — Patient Instructions (Signed)
Please schedule a lab draw for tomorrow. Follow up in 3 months as scheduled.

## 2014-03-09 NOTE — Progress Notes (Signed)
   Subjective:    Patient ID: Gregory Brennan, male    DOB: 1945-10-21, 68 y.o.   MRN: [id number]  HPI  Gregory Brennan is a 68 yr old male who presents today for follow up. He was noted last visit to be significantly hyponatremic with a sodium of 125.  Lisinopril hctz was changed to lisinopril and amlodipine.  He was advised to continue 48 oz fluid restriction.   BP Readings from Last 3 Encounters:  03/09/14 148/82  03/02/14 122/80  02/24/14 149/94     Review of Systems See HPI  Past Medical History  Diagnosis Date  . Allergy   . Hypertension     History   Social History  . Marital Status: Married    Spouse Name: N/A    Number of Children: N/A  . Years of Education: N/A   Occupational History  . Not on file.   Social History Main Topics  . Smoking status: Never Smoker   . Smokeless tobacco: Never Used  . Alcohol Use: No  . Drug Use: Not on file  . Sexual Activity: Not on file   Other Topics Concern  . 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 Abdominal hernia repair      Family History  Problem Relation Age of Onset  . Emphysema Mother   . Hypertension Father     Allergies  Allergen Reactions  . Penicillins Rash    Current Outpatient Prescriptions on File Prior to Visit  Medication Sig Dispense Refill  . amLODipine (NORVASC) 5 MG tablet Take 1 tablet (5 mg total) by mouth daily. 30 tablet 2  . lisinopril (PRINIVIL,ZESTRIL) 20 MG tablet Take 1 tablet (20 mg total) by mouth daily. 30 tablet 2   No current facility-administered medications on file prior to visit.    BP 128/82 mmHg  Pulse 83  Temp(Src) 97.6 F (36.4 C) (Oral)  Resp 16  Ht 5\' 10"  (1.778 m)  Wt 173 lb  3.2 oz (78.563 kg)  BMI 24.85 kg/m2  SpO2 99%       Objective:   Physical Exam  Constitutional: He is oriented to person, place, and time. He appears well-developed and well-nourished. No distress.  HENT:  Head: Normocephalic and atraumatic.  Cardiovascular: Normal rate and regular rhythm.   No murmur heard. Pulmonary/Chest: Effort normal and breath sounds normal. No respiratory distress. He has no wheezes. He has no rales. He exhibits no tenderness.  Neurological: He is alert and oriented to person, place, and time.  Psychiatric: He has a normal mood and affect. His behavior is normal. Judgment and thought content normal.          Assessment & Plan:

## 2014-03-09 NOTE — Assessment & Plan Note (Signed)
Stable on current meds.  Continue same. 

## 2014-03-09 NOTE — Assessment & Plan Note (Signed)
Obtain follow up bmet.  

## 2014-03-09 NOTE — Progress Notes (Signed)
Pre visit review using our clinic review tool, if applicable. No additional management support is needed unless otherwise documented below in the visit note. 

## 2014-03-10 ENCOUNTER — Encounter: Payer: Self-pay | Admitting: Family

## 2014-03-10 ENCOUNTER — Other Ambulatory Visit (INDEPENDENT_AMBULATORY_CARE_PROVIDER_SITE_OTHER): Payer: Medicare Other

## 2014-03-10 DIAGNOSIS — J3081 Allergic rhinitis due to animal (cat) (dog) hair and dander: Secondary | ICD-10-CM | POA: Diagnosis not present

## 2014-03-10 DIAGNOSIS — E871 Hypo-osmolality and hyponatremia: Secondary | ICD-10-CM | POA: Diagnosis not present

## 2014-03-10 DIAGNOSIS — J3089 Other allergic rhinitis: Secondary | ICD-10-CM | POA: Diagnosis not present

## 2014-03-10 DIAGNOSIS — J301 Allergic rhinitis due to pollen: Secondary | ICD-10-CM | POA: Diagnosis not present

## 2014-03-10 LAB — BASIC METABOLIC PANEL
BUN: 14 mg/dL (ref 6–23)
CALCIUM: 8.7 mg/dL (ref 8.4–10.5)
CO2: 26 meq/L (ref 19–32)
CREATININE: 0.9 mg/dL (ref 0.4–1.5)
Chloride: 98 mEq/L (ref 96–112)
GFR: 89.53 mL/min (ref >60.00)
GLUCOSE: 126 mg/dL — AB (ref 70–99)
Potassium: 3.8 mEq/L (ref 3.5–5.1)
Sodium: 130 mEq/L — ABNORMAL LOW (ref 135–145)

## 2014-03-17 DIAGNOSIS — J3081 Allergic rhinitis due to animal (cat) (dog) hair and dander: Secondary | ICD-10-CM | POA: Diagnosis not present

## 2014-03-17 DIAGNOSIS — J301 Allergic rhinitis due to pollen: Secondary | ICD-10-CM | POA: Diagnosis not present

## 2014-03-17 DIAGNOSIS — J3089 Other allergic rhinitis: Secondary | ICD-10-CM | POA: Diagnosis not present

## 2014-06-01 ENCOUNTER — Ambulatory Visit (INDEPENDENT_AMBULATORY_CARE_PROVIDER_SITE_OTHER): Payer: PPO | Admitting: Family

## 2014-06-01 ENCOUNTER — Encounter: Payer: Self-pay | Admitting: Family

## 2014-06-01 VITALS — BP 136/82 | HR 66 | Temp 98.2°F | Resp 16 | Ht 70.0 in | Wt 177.2 lb

## 2014-06-01 DIAGNOSIS — I10 Essential (primary) hypertension: Secondary | ICD-10-CM

## 2014-06-01 DIAGNOSIS — E871 Hypo-osmolality and hyponatremia: Secondary | ICD-10-CM

## 2014-06-01 LAB — BASIC METABOLIC PANEL
BUN: 14 mg/dL (ref 6–23)
CHLORIDE: 102 meq/L (ref 96–112)
CO2: 31 mEq/L (ref 19–32)
Calcium: 9.3 mg/dL (ref 8.4–10.5)
Creatinine, Ser: 0.98 mg/dL (ref 0.40–1.50)
GFR: 81.09 mL/min (ref >60.00)
Glucose, Bld: 108 mg/dL — ABNORMAL HIGH (ref 70–99)
Potassium: 3.9 mEq/L (ref 3.5–5.1)
SODIUM: 136 meq/L (ref 135–145)

## 2014-06-01 MED ORDER — AMLODIPINE BESYLATE 5 MG PO TABS: 5.0000 mg | ORAL_TABLET | Freq: Every day | ORAL | Status: DC

## 2014-06-01 MED ORDER — LISINOPRIL 20 MG PO TABS: 20.0000 mg | ORAL_TABLET | Freq: Every day | ORAL | Status: DC

## 2014-06-01 NOTE — Assessment & Plan Note (Signed)
Stable on current meds. Obtain bmet, send refills.

## 2014-06-01 NOTE — Patient Instructions (Signed)
Please continue current meds. Stop by the lab on the way out. Please follow up in 4 months.

## 2014-06-01 NOTE — Progress Notes (Signed)
   Subjective:    Patient ID: Gregory Brennan, male    DOB: [birth year], 69 y.o.   MRN: [id number]  HPI  Gregory Brennan is a 69 yr old male who presents today for follow up.   Hypertension- Patient is currently maintained on the following medications for blood pressure: amlodipine and lisinopril. Patient reports good compliance with blood pressure medications. Patient denies chest pain, shortness of breath or swelling. Last 3 blood pressure readings in our office are as follows: BP Readings from Last 3 Encounters:  06/01/14 136/82  03/09/14 128/82  03/02/14 122/80   Hyponatremia- Last sodium 2 months ago was 130.  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  Reports that he has cut back on his water consumption.    Review of Systems See HPI Past Medical History  Diagnosis Date  . Allergy   . Hypertension     History   Social History  . Marital Status: Married    Spouse Name: N/A  . Number of Children: N/A  . Years of Education: N/A   Occupational History  . Not on file.   Social History Main Topics  . Smoking status: Never Smoker   . Smokeless tobacco: Never Used  . Alcohol Use: No  . Drug Use: Not on file  . Sexual Activity: Not on file   Other Topics Concern  . 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 Abdominal hernia repair      Family History  Problem Relation Age of Onset  . Emphysema Mother   . Hypertension Father     Allergies  Allergen Reactions  . Penicillins Rash    Current Outpatient Prescriptions on File Prior to Visit  Medication Sig Dispense Refill  . amLODipine (NORVASC) 5 MG tablet Take  1 tablet (5 mg total) by mouth daily. 30 tablet 2  . lisinopril (PRINIVIL,ZESTRIL) 20 MG tablet Take 1 tablet (20 mg total) by mouth daily. 30 tablet 2   No current facility-administered medications on file prior to visit.    BP 136/82 mmHg  Pulse 66  Temp(Src) 98.2 F (36.8 C) (Oral)  Resp 16  Ht 5\' 10"  (1.778 m)  Wt 177 lb 3.2 oz (80.377 kg)  BMI 25.43 kg/m2  SpO2 98%        Objective:   Physical Exam  Constitutional: He is oriented to person, place, and time. He appears well-developed and well-nourished. No distress.  HENT:  Head: Normocephalic and atraumatic.  Cardiovascular: Normal rate and regular rhythm.   No murmur heard. Pulmonary/Chest: Effort normal and breath sounds normal. No respiratory distress. He has no wheezes. He has no rales.  Musculoskeletal: He exhibits no edema.  Neurological: He is alert and oriented to person, place, and time.  Skin: Skin is warm and dry.  Psychiatric: He has a normal mood and affect. His behavior is normal. Thought content normal.          Assessment & Plan:

## 2014-06-01 NOTE — Assessment & Plan Note (Signed)
Continue to keep fluids <64. Obtain follow up bmet.

## 2014-06-01 NOTE — Progress Notes (Signed)
Pre visit review using our clinic review tool, if applicable. No additional management support is needed unless otherwise documented below in the visit note. 

## 2014-06-02 ENCOUNTER — Encounter: Payer: Self-pay | Admitting: Family

## 2014-10-05 ENCOUNTER — Ambulatory Visit: Payer: PPO | Admitting: Family

## 2014-10-16 ENCOUNTER — Encounter: Payer: Self-pay | Admitting: Family

## 2014-10-16 ENCOUNTER — Ambulatory Visit (INDEPENDENT_AMBULATORY_CARE_PROVIDER_SITE_OTHER): Payer: PPO | Admitting: Family

## 2014-10-16 VITALS — BP 130/82 | HR 67 | Temp 98.2°F | Resp 16 | Ht 70.0 in | Wt 173.2 lb

## 2014-10-16 DIAGNOSIS — E871 Hypo-osmolality and hyponatremia: Secondary | ICD-10-CM

## 2014-10-16 DIAGNOSIS — L989 Disorder of the skin and subcutaneous tissue, unspecified: Secondary | ICD-10-CM

## 2014-10-16 DIAGNOSIS — I10 Essential (primary) hypertension: Secondary | ICD-10-CM | POA: Diagnosis not present

## 2014-10-16 LAB — BASIC METABOLIC PANEL
BUN: 10 mg/dL (ref 6–23)
CO2: 27 mEq/L (ref 19–32)
CREATININE: 0.9 mg/dL (ref 0.40–1.50)
Calcium: 9.2 mg/dL (ref 8.4–10.5)
Chloride: 100 mEq/L (ref 96–112)
GFR: 89.37 mL/min (ref >60.00)
Glucose, Bld: 105 mg/dL — ABNORMAL HIGH (ref 70–99)
Potassium: 3.4 mEq/L — ABNORMAL LOW (ref 3.5–5.1)
Sodium: 135 mEq/L (ref 135–145)

## 2014-10-16 NOTE — Progress Notes (Signed)
Pre visit review using our clinic review tool, if applicable. No additional management support is needed unless otherwise documented below in the visit note. 

## 2014-10-16 NOTE — Patient Instructions (Signed)
Please complete lab work prior to leaving.  Follow up in 6 months.  

## 2014-10-16 NOTE — Progress Notes (Signed)
   Subjective:    Patient ID: Gregory Brennan, male    DOB: 1945/07/21, 69 y.o.   MRN: [id number]  HPI  Gregory Brennan is a 69 yr old male who presents today for follow up.  HTN-  Currently maintained on amlodipine and lisinopril. BP Readings from Last 3 Encounters:  10/16/14 130/82  06/01/14 136/82  03/09/14 128/82   Hyponatremia- sodium was last checked in March and was 136.    Review of Systems  Respiratory: Negative for shortness of breath.   Cardiovascular: Negative for chest pain and leg swelling.   Past Medical History  Diagnosis Date  . Allergy   . Hypertension     History   Social History  . Marital Status: Married    Spouse Name: N/A  . Number of Children: N/A  . Years of Education: N/A   Occupational History  . Not on file.   Social History Main Topics  . Smoking status: Never Smoker   . Smokeless tobacco: Never Used  . Alcohol Use: No  . Drug Use: Not on file  . Sexual Activity: Not on file   Other Topics Concern  . 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 Abdominal hernia repair      Family History  Problem Relation Age of Onset  . Emphysema Mother   . Hypertension Father     Allergies  Allergen Reactions  . Penicillins Rash    Current Outpatient Prescriptions on File Prior to Visit  Medication Sig Dispense Refill  . amLODipine (NORVASC) 5 MG tablet Take 1 tablet (5 mg total) by mouth daily. 90 tablet 1  . lisinopril (PRINIVIL,ZESTRIL) 20 MG tablet Take 1 tablet (20 mg total) by mouth daily. 90 tablet 1   No current facility-administered medications on file prior to visit.    BP 130/82 mmHg  Pulse 67  Temp(Src) 98.2 F (36.8 C) (Oral)  Resp 16  Ht  5\' 10"  (1.778 m)  Wt 173 lb 3.2 oz (78.563 kg)  BMI 24.85 kg/m2  SpO2 97%       Objective:   Physical Exam  Constitutional: Gregory Brennan is oriented to person, place, and time. Gregory Brennan appears well-developed and well-nourished. No distress.  HENT:  Head: Normocephalic and atraumatic.  Cardiovascular: Normal rate and regular rhythm.   No murmur heard. Pulmonary/Chest: Effort normal and breath sounds normal. No respiratory distress. Gregory Brennan has no wheezes. Gregory Brennan has no rales.  Musculoskeletal: Gregory Brennan exhibits no edema.  Neurological: Gregory Brennan is alert and oriented to person, place, and time.  Skin: Skin is warm and dry.  Irregular lesion right temple  Psychiatric: Gregory Brennan has a normal mood and affect. Gregory Brennan behavior is normal. Thought content normal.          Assessment & Plan:  Skin Lesion- concerning discoloration- refer to derm or excision.

## 2014-10-16 NOTE — Assessment & Plan Note (Addendum)
Repeat sodium level.  Was most likely related to diuretic.

## 2014-10-16 NOTE — Assessment & Plan Note (Signed)
Stable on current meds.  Continue same. 

## 2014-10-17 ENCOUNTER — Telehealth: Payer: Self-pay | Admitting: Family

## 2014-10-17 DIAGNOSIS — E876 Hypokalemia: Secondary | ICD-10-CM

## 2014-10-17 NOTE — Telephone Encounter (Signed)
Sodium looks good but potassium is low.  Advised increase high potassium foods, repeat bmet in 2 weeks dx hypokalemia.    High potassium content foods  Highest content (>25 meq/100 g) High content (>6.2 meq/100 g)   Vegetables   Spinach   Tomatoes   Broccoli   Winter squash   Beets   Carrots   Cauliflower   Potatoes   Fruits   Bananas  Dried figs Cantaloupe  Molasses Kiwis  Seaweed Oranges  Very high content (>12.5 meq/100 g) Mangos  Dried fruits (dates, prunes) Meats  Nuts Ground beef  Avocados Steak  Bran cereals Pork  Wheat germ Veal  Lima beans Arline Asp

## 2014-10-19 NOTE — Telephone Encounter (Signed)
Notified pt and he voices understanding.  Lab appt scheduled for 11/02/14 and future order entered.

## 2014-11-02 ENCOUNTER — Other Ambulatory Visit (INDEPENDENT_AMBULATORY_CARE_PROVIDER_SITE_OTHER): Payer: PPO

## 2014-11-02 DIAGNOSIS — E876 Hypokalemia: Secondary | ICD-10-CM

## 2014-11-02 LAB — BASIC METABOLIC PANEL
BUN: 11 mg/dL (ref 6–23)
CALCIUM: 9.1 mg/dL (ref 8.4–10.5)
CO2: 29 meq/L (ref 19–32)
CREATININE: 0.89 mg/dL (ref 0.40–1.50)
Chloride: 99 mEq/L (ref 96–112)
GFR: 90.51 mL/min (ref >60.00)
Glucose, Bld: 98 mg/dL (ref 70–99)
Potassium: 4.1 mEq/L (ref 3.5–5.1)
SODIUM: 134 meq/L — AB (ref 135–145)

## 2014-11-03 ENCOUNTER — Encounter: Payer: Self-pay | Admitting: Family

## 2014-11-21 ENCOUNTER — Other Ambulatory Visit: Payer: Self-pay | Admitting: Family

## 2015-01-22 ENCOUNTER — Telehealth: Payer: Self-pay | Admitting: Family

## 2015-01-22 NOTE — Telephone Encounter (Signed)
Relation to HG:DJME Call back [phone]   Reason for call:  Patient would like pneumonia / shingles / flu vaccination on the same day, please advise if this is possible best # [phone]

## 2015-01-25 NOTE — Telephone Encounter (Signed)
Signed order set placed in folder in triage/nurse visit room.

## 2015-01-25 NOTE — Telephone Encounter (Signed)
Advise pt OK to give all three on the same day.

## 2015-01-25 NOTE — Telephone Encounter (Signed)
Notified pt and scheduled nurse visit for 01/29/15 at 3pm. Vaccine order set sent to PCP for signature.

## 2015-01-25 NOTE — Telephone Encounter (Signed)
Pt had prevnar 01/26/14.  How should we space these out?

## 2015-01-29 ENCOUNTER — Ambulatory Visit (INDEPENDENT_AMBULATORY_CARE_PROVIDER_SITE_OTHER): Payer: PPO | Admitting: Behavioral Health

## 2015-01-29 DIAGNOSIS — Z23 Encounter for immunization: Secondary | ICD-10-CM

## 2015-01-29 NOTE — Progress Notes (Signed)
Pre visit review using our clinic review tool, if applicable. No additional management support is needed unless otherwise documented below in the visit note. Patient tolerated injections well.   

## 2015-02-08 ENCOUNTER — Ambulatory Visit (INDEPENDENT_AMBULATORY_CARE_PROVIDER_SITE_OTHER): Payer: PPO | Admitting: Family

## 2015-02-08 ENCOUNTER — Encounter: Payer: Self-pay | Admitting: Family

## 2015-02-08 VITALS — BP 117/75 | HR 63 | Temp 98.0°F | Resp 16 | Ht 70.0 in | Wt 177.4 lb

## 2015-02-08 DIAGNOSIS — J019 Acute sinusitis, unspecified: Secondary | ICD-10-CM | POA: Diagnosis not present

## 2015-02-08 MED ORDER — CEFDINIR 300 MG PO CAPS: 300.0000 mg | ORAL_CAPSULE | Freq: Two times a day (BID) | ORAL | Status: DC

## 2015-02-08 NOTE — Progress Notes (Signed)
Pre visit review using our clinic review tool, if applicable. No additional management support is needed unless otherwise documented below in the visit note. 

## 2015-02-08 NOTE — Progress Notes (Signed)
Subjective:    Patient ID: Gregory Brennan, male    DOB: 16-Jan-1946, 69 y.o.   MRN: [id number]  HPI  Mr. Gawne is a 69 yr old male who presents today with chief complaint of nasal congestion.   Nasal congestion has been present x 3-4 weeks.  Using saline rinses twice daily, mucinex. Reports associated frontal sinus headaches which began 6 days ago.  Reports mild cough which is intermittent. Nasal drainage is brown/green.     Review of Systems    see HPI  Past Medical History  Diagnosis Date  . Allergy   . Hypertension     Social History   Social History  . Marital Status: Married    Spouse Name: N/A  . Number of Children: N/A  . Years of Education: N/A   Occupational History  . Not on file.   Social History Main Topics  . Smoking status: Never Smoker   . Smokeless tobacco: Never Used  . Alcohol Use: No  . Drug Use: Not on file  . Sexual Activity: Not on file   Other Topics Concern  . 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 Abdominal hernia repair      Family History  Problem Relation Age of Onset  . Emphysema Mother   . Hypertension Father     Allergies  Allergen Reactions  . Penicillins Rash    Current Outpatient Prescriptions on File Prior to Visit  Medication Sig Dispense Refill  . amLODipine (NORVASC) 5 MG tablet TAKE 1 TABLET BY MOUTH EVERY DAY 90 tablet 1  . fluticasone (FLONASE) 50 MCG/ACT nasal spray Place 1 spray into both nostrils daily as needed.     Marland Kitchen lisinopril (PRINIVIL,ZESTRIL) 20 MG tablet TAKE 1 TABLET BY MOUTH EVERY DAY 90 tablet 1  . loratadine (CLARITIN) 10 MG tablet Take 10 mg by mouth daily as needed for allergies.     No current  facility-administered medications on file prior to visit.    BP 117/75 mmHg  Pulse 63  Temp(Src) 98 F (36.7 C) (Oral)  Resp 16  Ht 5\' 10"  (1.778 m)  Wt 177 lb 6.4 oz (80.468 kg)  BMI 25.45 kg/m2  SpO2 100%    Objective:   Physical Exam  Constitutional: He is oriented to person, place, and time. He appears well-developed and well-nourished. No distress.  HENT:  Head: Normocephalic and atraumatic.  Right Ear: Tympanic membrane and ear canal normal.  Left Ear: Tympanic membrane and ear canal normal.  Nose: Right sinus exhibits no maxillary sinus tenderness and no frontal sinus tenderness. Left sinus exhibits no maxillary sinus tenderness and no frontal sinus tenderness.  Mouth/Throat: No oropharyngeal exudate, posterior oropharyngeal edema, posterior oropharyngeal erythema or tonsillar abscesses.  Cardiovascular: Normal rate and regular rhythm.   No murmur heard. Pulmonary/Chest: Effort normal and breath sounds normal. No respiratory distress. He has no wheezes. He has no rales.  Musculoskeletal: He exhibits no edema.  Neurological: He is alert and oriented to person, place, and time.  Skin: Skin is warm and dry.  Psychiatric: He has a normal mood and affect. His behavior is normal.  Thought content normal.          Assessment & Plan:  Symptoms most consistent with sinusitis. Rx with cefdinir (rash with penicillin).  Continue flonase, nasal saline, claritin. Pt advised to follow up if symptoms worsen or do not improve.

## 2015-02-08 NOTE — Patient Instructions (Signed)
Start cefdinir for sinus infection. Continue flonase, nasal saline, and claritin. Call if symptoms worsen or if not improved in 3 days.  Sinusitis, Adult Sinusitis is redness, soreness, and inflammation of the paranasal sinuses. Paranasal sinuses are air pockets within the bones of your face. They are located beneath your eyes, in the middle of your forehead, and above your eyes. In healthy paranasal sinuses, mucus is able to drain out, and air is able to circulate through them by way of your nose. However, when your paranasal sinuses are inflamed, mucus and air can become trapped. This can allow bacteria and other germs to grow and cause infection. Sinusitis can develop quickly and last only a short time (acute) or continue over a long period (chronic). Sinusitis that lasts for more than 12 weeks is considered chronic. CAUSES Causes of sinusitis include:  Allergies.  Structural abnormalities, such as displacement of the cartilage that separates your nostrils (deviated septum), which can decrease the air flow through your nose and sinuses and affect sinus drainage.  Functional abnormalities, such as when the small hairs (cilia) that line your sinuses and help remove mucus do not work properly or are not present. SIGNS AND SYMPTOMS Symptoms of acute and chronic sinusitis are the same. The primary symptoms are pain and pressure around the affected sinuses. Other symptoms include:  Upper toothache.  Earache.  Headache.  Bad breath.  Decreased sense of smell and taste.  A cough, which worsens when you are lying flat.  Fatigue.  Fever.  Thick drainage from your nose, which often is green and may contain pus (purulent).  Swelling and warmth over the affected sinuses. DIAGNOSIS Your health care provider will perform a physical exam. During your exam, your health care provider may perform any of the following to help determine if you have acute sinusitis or chronic sinusitis:  Look in  your nose for signs of abnormal growths in your nostrils (nasal polyps).  Tap over the affected sinus to check for signs of infection.  View the inside of your sinuses using an imaging device that has a light attached (endoscope). If your health care provider suspects that you have chronic sinusitis, one or more of the following tests may be recommended:  Allergy tests.  Nasal culture. A sample of mucus is taken from your nose, sent to a lab, and screened for bacteria.  Nasal cytology. A sample of mucus is taken from your nose and examined by your health care provider to determine if your sinusitis is related to an allergy. TREATMENT Most cases of acute sinusitis are related to a viral infection and will resolve on their own within 10 days. Sometimes, medicines are prescribed to help relieve symptoms of both acute and chronic sinusitis. These may include pain medicines, decongestants, nasal steroid sprays, or saline sprays. However, for sinusitis related to a bacterial infection, your health care provider will prescribe antibiotic medicines. These are medicines that will help kill the bacteria causing the infection. Rarely, sinusitis is caused by a fungal infection. In these cases, your health care provider will prescribe antifungal medicine. For some cases of chronic sinusitis, surgery is needed. Generally, these are cases in which sinusitis recurs more than 3 times per year, despite other treatments. HOME CARE INSTRUCTIONS  Drink plenty of water. Water helps thin the mucus so your sinuses can drain more easily.  Use a humidifier.  Inhale steam 3-4 times a day (for example, sit in the bathroom with the shower running).  Apply a warm, moist  washcloth to your face 3-4 times a day, or as directed by your health care provider.  Use saline nasal sprays to help moisten and clean your sinuses.  Take medicines only as directed by your health care provider.  If you were prescribed either an  antibiotic or antifungal medicine, finish it all even if you start to feel better. SEEK IMMEDIATE MEDICAL CARE IF:  You have increasing pain or severe headaches.  You have nausea, vomiting, or drowsiness.  You have swelling around your face.  You have vision problems.  You have a stiff neck.  You have difficulty breathing.   This information is not intended to replace advice given to you by your health care provider. Make sure you discuss any questions you have with your health care provider.   Document Released: 03/06/2005 Document Revised: 03/27/2014 Document Reviewed: 03/21/2011 Elsevier Interactive Patient Education [employer].

## 2015-03-01 ENCOUNTER — Telehealth: Payer: Self-pay | Admitting: Family

## 2015-03-01 NOTE — Telephone Encounter (Signed)
Caller name: Self   Can be reached: [phone]    Reason for call: [facility] medication and he took it for 90 days. It was a BP medication and he wants to know if he should be worried

## 2015-03-01 NOTE — Telephone Encounter (Signed)
Spoke with pt. He states that [facility] gave [facility] 10mg  daily last time he filled Rx. Took it for 3 months. Has not noticed any adverse effects. [facility] will notify [facility] of [facility] error. Verified that he now has the amlodipine 5mg  as reflected in his chart.  Also notes that he completed cefdinir at end of November for sinus infection. States symptoms improved but did not go away. Congestion is getting worse and he is requesting another round of Cefdinir?

## 2015-03-01 NOTE — Telephone Encounter (Signed)
He should be seen in office for repeat bp on current dose amlodipine and follow up of sinus infection for further recs.

## 2015-03-02 NOTE — Telephone Encounter (Signed)
Notified pt and scheduled appt for tomorrow at 1:45pm with Inda Castle, NP

## 2015-03-03 ENCOUNTER — Ambulatory Visit (INDEPENDENT_AMBULATORY_CARE_PROVIDER_SITE_OTHER): Payer: PPO | Admitting: Family

## 2015-03-03 ENCOUNTER — Encounter: Payer: Self-pay | Admitting: Family

## 2015-03-03 VITALS — BP 140/72 | HR 72 | Temp 97.8°F | Resp 16 | Ht 70.0 in | Wt 177.6 lb

## 2015-03-03 DIAGNOSIS — I10 Essential (primary) hypertension: Secondary | ICD-10-CM | POA: Diagnosis not present

## 2015-03-03 DIAGNOSIS — Z91048 Other nonmedicinal substance allergy status: Secondary | ICD-10-CM | POA: Diagnosis not present

## 2015-03-03 DIAGNOSIS — Z9109 Other allergy status, other than to drugs and biological substances: Secondary | ICD-10-CM

## 2015-03-03 NOTE — Assessment & Plan Note (Signed)
Symptoms most consistent with allergic rhinitis. Advised pt to try switching from claritin to zytec. restart flonase.

## 2015-03-03 NOTE — Assessment & Plan Note (Signed)
BP OK today, but did look a bit better when he was taking amlodipine 10mg . Advised pt to check bp daily at home for the next week then send me his readings.

## 2015-03-03 NOTE — Progress Notes (Signed)
Subjective:    Patient ID: Gregory Brennan, male    DOB: 06-18-1945, 69 y.o.   MRN: [id number]  HPI  Mr. Gregory Brennan is a 69 yr old male who presents today for follow up.  1) HTN-  Our records indicate that he should be on amlodipine 5mg , however [facility] .  Reports that he was given 90 day supply of the 10mg .  Started the 5 mg again 2 days.   BP Readings from Last 3 Encounters:  03/03/15 140/72  02/08/15 117/75  10/16/14 130/82   2)  Sinusitis- was treated on 11/21 with cefdinir.  Initially symptoms improved- however symptoms are not resolved.  After completion of the antibiotic, congestion began to worsen again.  Denies facial pain, but does have a mild frontal HA.  He is using claritin.  He ran out of flonase 2 days ago.  Energy is good.     Review of Systems    see HPI  Past Medical History  Diagnosis Date  . Allergy   . Hypertension     Social History   Social History  . Marital Status: Married    Spouse Name: N/A  . Number of Children: N/A  . Years of Education: N/A   Occupational History  . Not on file.   Social History Main Topics  . Smoking status: Never Smoker   . Smokeless tobacco: Never Used  . Alcohol Use: No  . Drug Use: Not on file  . Sexual Activity: Not on file   Other Topics Concern  . 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 Abdominal hernia repair      Family History  Problem Relation Age of Onset  . Emphysema Mother   . Hypertension Father     Allergies  Allergen Reactions  . Penicillins Rash    Current Outpatient Prescriptions on File Prior to Visit  Medication Sig Dispense Refill  . amLODipine (NORVASC) 5 MG tablet TAKE  1 TABLET BY MOUTH EVERY DAY 90 tablet 1  . fluticasone (FLONASE) 50 MCG/ACT nasal spray Place 1 spray into both nostrils daily as needed.     Marland Kitchen lisinopril (PRINIVIL,ZESTRIL) 20 MG tablet TAKE 1 TABLET BY MOUTH EVERY DAY 90 tablet 1  . loratadine (CLARITIN) 10 MG tablet Take 10 mg by mouth daily as needed for allergies.     No current facility-administered medications on file prior to visit.    BP 140/72 mmHg  Pulse 72  Temp(Src) 97.8 F (36.6 C) (Oral)  Resp 16  Ht 5\' 10"  (1.778 m)  Wt 177 lb 9.6 oz (80.559 kg)  BMI 25.48 kg/m2  SpO2 100%    Objective:   Physical Exam  Constitutional: He is oriented to person, place, and time. He appears well-developed and well-nourished. No distress.  HENT:  Head: Normocephalic and atraumatic.  Right Ear: Tympanic membrane and ear canal normal.  Left Ear: Tympanic membrane and ear canal normal.  Nose: Right sinus exhibits no frontal sinus tenderness. Left sinus exhibits no frontal sinus tenderness.  Cardiovascular: Normal rate and regular rhythm.   No murmur heard. Pulmonary/Chest: Effort  normal and breath sounds normal. No respiratory distress. He has no wheezes. He has no rales.  Musculoskeletal: He exhibits no edema.  Neurological: He is alert and oriented to person, place, and time.  Skin: Skin is warm and dry.  Psychiatric: He has a normal mood and affect. His behavior is normal. Thought content normal.          Assessment & Plan:

## 2015-03-03 NOTE — Patient Instructions (Signed)
Check blood pressure once daily for 1 week- send me your readings in my chart. Try switching claritin to zyrtec. Restart flonase. Call if fever >101, worsening sinus congestion.

## 2015-03-11 ENCOUNTER — Encounter: Payer: Self-pay | Admitting: Family

## 2015-03-24 DIAGNOSIS — J3089 Other allergic rhinitis: Secondary | ICD-10-CM | POA: Diagnosis not present

## 2015-03-24 DIAGNOSIS — J3081 Allergic rhinitis due to animal (cat) (dog) hair and dander: Secondary | ICD-10-CM | POA: Diagnosis not present

## 2015-03-24 DIAGNOSIS — J301 Allergic rhinitis due to pollen: Secondary | ICD-10-CM | POA: Diagnosis not present

## 2015-03-25 DIAGNOSIS — J301 Allergic rhinitis due to pollen: Secondary | ICD-10-CM | POA: Diagnosis not present

## 2015-03-25 DIAGNOSIS — J3089 Other allergic rhinitis: Secondary | ICD-10-CM | POA: Diagnosis not present

## 2015-03-30 DIAGNOSIS — J301 Allergic rhinitis due to pollen: Secondary | ICD-10-CM | POA: Diagnosis not present

## 2015-03-30 DIAGNOSIS — J3089 Other allergic rhinitis: Secondary | ICD-10-CM | POA: Diagnosis not present

## 2015-04-07 DIAGNOSIS — J301 Allergic rhinitis due to pollen: Secondary | ICD-10-CM | POA: Diagnosis not present

## 2015-04-07 DIAGNOSIS — J3081 Allergic rhinitis due to animal (cat) (dog) hair and dander: Secondary | ICD-10-CM | POA: Diagnosis not present

## 2015-04-07 DIAGNOSIS — J3089 Other allergic rhinitis: Secondary | ICD-10-CM | POA: Diagnosis not present

## 2015-04-14 DIAGNOSIS — J301 Allergic rhinitis due to pollen: Secondary | ICD-10-CM | POA: Diagnosis not present

## 2015-04-14 DIAGNOSIS — J3089 Other allergic rhinitis: Secondary | ICD-10-CM | POA: Diagnosis not present

## 2015-04-19 DIAGNOSIS — J3081 Allergic rhinitis due to animal (cat) (dog) hair and dander: Secondary | ICD-10-CM | POA: Diagnosis not present

## 2015-04-19 DIAGNOSIS — J301 Allergic rhinitis due to pollen: Secondary | ICD-10-CM | POA: Diagnosis not present

## 2015-04-19 DIAGNOSIS — J3089 Other allergic rhinitis: Secondary | ICD-10-CM | POA: Diagnosis not present

## 2015-04-20 ENCOUNTER — Ambulatory Visit: Payer: PPO | Admitting: Family

## 2015-04-27 DIAGNOSIS — J3089 Other allergic rhinitis: Secondary | ICD-10-CM | POA: Diagnosis not present

## 2015-04-27 DIAGNOSIS — J301 Allergic rhinitis due to pollen: Secondary | ICD-10-CM | POA: Diagnosis not present

## 2015-04-27 DIAGNOSIS — J3081 Allergic rhinitis due to animal (cat) (dog) hair and dander: Secondary | ICD-10-CM | POA: Diagnosis not present

## 2015-04-30 DIAGNOSIS — J3081 Allergic rhinitis due to animal (cat) (dog) hair and dander: Secondary | ICD-10-CM | POA: Diagnosis not present

## 2015-05-05 DIAGNOSIS — J301 Allergic rhinitis due to pollen: Secondary | ICD-10-CM | POA: Diagnosis not present

## 2015-05-05 DIAGNOSIS — J3089 Other allergic rhinitis: Secondary | ICD-10-CM | POA: Diagnosis not present

## 2015-05-10 DIAGNOSIS — J301 Allergic rhinitis due to pollen: Secondary | ICD-10-CM | POA: Diagnosis not present

## 2015-05-10 DIAGNOSIS — J3081 Allergic rhinitis due to animal (cat) (dog) hair and dander: Secondary | ICD-10-CM | POA: Diagnosis not present

## 2015-05-10 DIAGNOSIS — J3089 Other allergic rhinitis: Secondary | ICD-10-CM | POA: Diagnosis not present

## 2015-05-12 DIAGNOSIS — J301 Allergic rhinitis due to pollen: Secondary | ICD-10-CM | POA: Diagnosis not present

## 2015-05-12 DIAGNOSIS — J3089 Other allergic rhinitis: Secondary | ICD-10-CM | POA: Diagnosis not present

## 2015-05-14 DIAGNOSIS — J3089 Other allergic rhinitis: Secondary | ICD-10-CM | POA: Diagnosis not present

## 2015-05-14 DIAGNOSIS — J3081 Allergic rhinitis due to animal (cat) (dog) hair and dander: Secondary | ICD-10-CM | POA: Diagnosis not present

## 2015-05-14 DIAGNOSIS — J301 Allergic rhinitis due to pollen: Secondary | ICD-10-CM | POA: Diagnosis not present

## 2015-05-18 ENCOUNTER — Other Ambulatory Visit: Payer: Self-pay | Admitting: Family

## 2015-05-18 DIAGNOSIS — J3089 Other allergic rhinitis: Secondary | ICD-10-CM | POA: Diagnosis not present

## 2015-05-18 DIAGNOSIS — J301 Allergic rhinitis due to pollen: Secondary | ICD-10-CM | POA: Diagnosis not present

## 2015-05-25 ENCOUNTER — Other Ambulatory Visit: Payer: Self-pay | Admitting: Family

## 2015-05-25 DIAGNOSIS — J301 Allergic rhinitis due to pollen: Secondary | ICD-10-CM | POA: Diagnosis not present

## 2015-05-25 DIAGNOSIS — J3089 Other allergic rhinitis: Secondary | ICD-10-CM | POA: Diagnosis not present

## 2015-05-25 MED ORDER — AMLODIPINE BESYLATE 5 MG PO TABS: 5.0000 mg | ORAL_TABLET | Freq: Every day | ORAL | Status: DC

## 2015-05-25 NOTE — Telephone Encounter (Signed)
Caller name: Burlen Relation to pt: self Call back number: [phone] Pharmacy: [facility]  Reason for call: Pt is requesting refill for rx amLODipine (NORVASC) 5 MG tablet, pt states only has meds to get threw Saturday. Please advise.

## 2015-05-25 NOTE — Telephone Encounter (Signed)
Rx sent 

## 2015-06-02 DIAGNOSIS — J3081 Allergic rhinitis due to animal (cat) (dog) hair and dander: Secondary | ICD-10-CM | POA: Diagnosis not present

## 2015-06-02 DIAGNOSIS — J3089 Other allergic rhinitis: Secondary | ICD-10-CM | POA: Diagnosis not present

## 2015-06-02 DIAGNOSIS — J301 Allergic rhinitis due to pollen: Secondary | ICD-10-CM | POA: Diagnosis not present

## 2015-06-07 ENCOUNTER — Telehealth: Payer: Self-pay | Admitting: *Deleted

## 2015-06-07 MED ORDER — LISINOPRIL 20 MG PO TABS: 20.0000 mg | ORAL_TABLET | Freq: Every day | ORAL | Status: DC

## 2015-06-07 MED ORDER — AMLODIPINE BESYLATE 5 MG PO TABS: 5.0000 mg | ORAL_TABLET | Freq: Every day | ORAL | Status: DC

## 2015-06-07 NOTE — Telephone Encounter (Signed)
Rx placed at front desk for pick up.  

## 2015-06-07 NOTE — Telephone Encounter (Signed)
Pt's wife called stating they are flying to [place] on Friday and they would like to get written Rxs of pt's BP meds to have in case anything happens to the supply they have on hand. Rxs printed and forwarded to PCP for signature. Advised her we would place Rxs at front desk for pick up tonight.

## 2015-06-09 DIAGNOSIS — J3089 Other allergic rhinitis: Secondary | ICD-10-CM | POA: Diagnosis not present

## 2015-06-09 DIAGNOSIS — J301 Allergic rhinitis due to pollen: Secondary | ICD-10-CM | POA: Diagnosis not present

## 2015-06-11 ENCOUNTER — Encounter: Payer: Self-pay | Admitting: Family

## 2015-06-15 ENCOUNTER — Telehealth: Payer: Self-pay | Admitting: Family

## 2015-06-15 NOTE — Telephone Encounter (Signed)
Pt says [place] but is experiencing some sinus infection symptoms. He says that they are the same exact symptoms that he had once before when seeing PCP. He would like to know if PCP could provide the same Rx for him as before. Pt would like to know if Rx could be sent to him via My Chart and then he will take it to a pharmacy. He says that were he is located he's not sure of pharmacy locations.

## 2015-06-15 NOTE — Telephone Encounter (Signed)
Notified pt, he voices understanding.

## 2015-06-15 NOTE — Telephone Encounter (Signed)
Unfortunately we are unable to send via mychart and I don't have prescriptive authority outside of the country.    I would recommend that he be seen in a local urgent care or clinic.

## 2015-06-23 DIAGNOSIS — J301 Allergic rhinitis due to pollen: Secondary | ICD-10-CM | POA: Diagnosis not present

## 2015-06-23 DIAGNOSIS — J3089 Other allergic rhinitis: Secondary | ICD-10-CM | POA: Diagnosis not present

## 2015-06-30 DIAGNOSIS — J3089 Other allergic rhinitis: Secondary | ICD-10-CM | POA: Diagnosis not present

## 2015-06-30 DIAGNOSIS — J3081 Allergic rhinitis due to animal (cat) (dog) hair and dander: Secondary | ICD-10-CM | POA: Diagnosis not present

## 2015-06-30 DIAGNOSIS — J301 Allergic rhinitis due to pollen: Secondary | ICD-10-CM | POA: Diagnosis not present

## 2015-07-05 DIAGNOSIS — J301 Allergic rhinitis due to pollen: Secondary | ICD-10-CM | POA: Diagnosis not present

## 2015-07-05 DIAGNOSIS — J3089 Other allergic rhinitis: Secondary | ICD-10-CM | POA: Diagnosis not present

## 2015-07-05 DIAGNOSIS — J3081 Allergic rhinitis due to animal (cat) (dog) hair and dander: Secondary | ICD-10-CM | POA: Diagnosis not present

## 2015-07-07 DIAGNOSIS — J301 Allergic rhinitis due to pollen: Secondary | ICD-10-CM | POA: Diagnosis not present

## 2015-07-07 DIAGNOSIS — J3081 Allergic rhinitis due to animal (cat) (dog) hair and dander: Secondary | ICD-10-CM | POA: Diagnosis not present

## 2015-07-07 DIAGNOSIS — J3089 Other allergic rhinitis: Secondary | ICD-10-CM | POA: Diagnosis not present

## 2015-07-14 DIAGNOSIS — J3081 Allergic rhinitis due to animal (cat) (dog) hair and dander: Secondary | ICD-10-CM | POA: Diagnosis not present

## 2015-07-14 DIAGNOSIS — J301 Allergic rhinitis due to pollen: Secondary | ICD-10-CM | POA: Diagnosis not present

## 2015-07-14 DIAGNOSIS — J3089 Other allergic rhinitis: Secondary | ICD-10-CM | POA: Diagnosis not present

## 2015-07-21 DIAGNOSIS — J3081 Allergic rhinitis due to animal (cat) (dog) hair and dander: Secondary | ICD-10-CM | POA: Diagnosis not present

## 2015-07-21 DIAGNOSIS — J301 Allergic rhinitis due to pollen: Secondary | ICD-10-CM | POA: Diagnosis not present

## 2015-07-21 DIAGNOSIS — J3089 Other allergic rhinitis: Secondary | ICD-10-CM | POA: Diagnosis not present

## 2015-07-28 DIAGNOSIS — J3089 Other allergic rhinitis: Secondary | ICD-10-CM | POA: Diagnosis not present

## 2015-07-28 DIAGNOSIS — J301 Allergic rhinitis due to pollen: Secondary | ICD-10-CM | POA: Diagnosis not present

## 2015-07-28 DIAGNOSIS — J3081 Allergic rhinitis due to animal (cat) (dog) hair and dander: Secondary | ICD-10-CM | POA: Diagnosis not present

## 2015-08-04 DIAGNOSIS — J3089 Other allergic rhinitis: Secondary | ICD-10-CM | POA: Diagnosis not present

## 2015-08-04 DIAGNOSIS — J301 Allergic rhinitis due to pollen: Secondary | ICD-10-CM | POA: Diagnosis not present

## 2015-08-11 DIAGNOSIS — J3089 Other allergic rhinitis: Secondary | ICD-10-CM | POA: Diagnosis not present

## 2015-08-11 DIAGNOSIS — J301 Allergic rhinitis due to pollen: Secondary | ICD-10-CM | POA: Diagnosis not present

## 2015-08-12 DIAGNOSIS — J301 Allergic rhinitis due to pollen: Secondary | ICD-10-CM | POA: Diagnosis not present

## 2015-08-12 DIAGNOSIS — J3089 Other allergic rhinitis: Secondary | ICD-10-CM | POA: Diagnosis not present

## 2015-08-25 DIAGNOSIS — J3081 Allergic rhinitis due to animal (cat) (dog) hair and dander: Secondary | ICD-10-CM | POA: Diagnosis not present

## 2015-08-25 DIAGNOSIS — J301 Allergic rhinitis due to pollen: Secondary | ICD-10-CM | POA: Diagnosis not present

## 2015-08-25 DIAGNOSIS — J3089 Other allergic rhinitis: Secondary | ICD-10-CM | POA: Diagnosis not present

## 2015-09-01 DIAGNOSIS — J3089 Other allergic rhinitis: Secondary | ICD-10-CM | POA: Diagnosis not present

## 2015-09-01 DIAGNOSIS — J301 Allergic rhinitis due to pollen: Secondary | ICD-10-CM | POA: Diagnosis not present

## 2015-09-03 ENCOUNTER — Ambulatory Visit: Payer: PPO | Admitting: Family

## 2015-09-06 ENCOUNTER — Ambulatory Visit (INDEPENDENT_AMBULATORY_CARE_PROVIDER_SITE_OTHER): Payer: PPO | Admitting: Family

## 2015-09-06 ENCOUNTER — Encounter: Payer: Self-pay | Admitting: Family

## 2015-09-06 VITALS — BP 136/77 | HR 70 | Temp 98.4°F | Resp 16 | Ht 70.0 in | Wt 176.8 lb

## 2015-09-06 DIAGNOSIS — I10 Essential (primary) hypertension: Secondary | ICD-10-CM

## 2015-09-06 MED ORDER — LISINOPRIL 20 MG PO TABS: 20.0000 mg | ORAL_TABLET | Freq: Every day | ORAL | Status: DC

## 2015-09-06 MED ORDER — AMLODIPINE BESYLATE 5 MG PO TABS: 5.0000 mg | ORAL_TABLET | Freq: Every day | ORAL | Status: DC

## 2015-09-06 NOTE — Progress Notes (Signed)
   Subjective:    Patient ID: Monolito Oravetz, male    DOB: [birth year], 70 y.o.   MRN: [id number]  HPI  Mr. Klebe is a 70 yr old male with history of hypertension.  Has changed his diet and is working out. He is on lisinopril and amlodipine.  Reports good compliance with medication.   BP Readings from Last 3 Encounters:  09/06/15 136/77  03/03/15 140/72  02/08/15 117/75    Review of Systems    see HPI  Past Medical History  Diagnosis Date  . Allergy   . Hypertension      Social History   Social History  . Marital Status: Married    Spouse Name: N/A  . Number of Children: N/A  . Years of Education: N/A   Occupational History  . Not on file.   Social History Main Topics  . Smoking status: Never Smoker   . Smokeless tobacco: Never Used  . Alcohol Use: No  . Drug Use: Not on file  . Sexual Activity: Not on file   Other Topics Concern  . 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 Abdominal hernia repair      Family History  Problem Relation Age of Onset  . Emphysema Mother   . Hypertension Father     Allergies  Allergen Reactions  . Penicillins Rash    Current Outpatient Prescriptions on File Prior to Visit  Medication Sig Dispense Refill  . amLODipine (NORVASC) 5 MG tablet Take 1 tablet (5 mg total) by mouth daily. 30 tablet 0  . fluticasone (FLONASE) 50 MCG/ACT nasal spray Place 1 spray into both nostrils daily as needed.     Marland Kitchen lisinopril (PRINIVIL,ZESTRIL) 20 MG tablet Take 1 tablet (20 mg total) by mouth daily. 30 tablet 0   No current facility-administered medications on file prior to visit.    BP 136/77 mmHg  Pulse 70  Temp(Src) 98.4 F (36.9 C) (Oral)  Resp  16  Ht 5\' 10"  (1.778 m)  Wt 176 lb 12.8 oz (80.196 kg)  BMI 25.37 kg/m2  SpO2 96%    Objective:   Physical Exam  Constitutional: He is oriented to person, place, and time. He appears well-developed and well-nourished. No distress.  HENT:  Head: Normocephalic and atraumatic.  Cardiovascular: Normal rate and regular rhythm.   No murmur heard. Pulmonary/Chest: Effort normal and breath sounds normal. No respiratory distress. He has no wheezes. He has no rales.  Musculoskeletal: He exhibits no edema.  Neurological: He is alert and oriented to person, place, and time.  Skin: Skin is warm and dry.  Psychiatric: He has a normal mood and affect. His behavior is normal. Thought content normal.          Assessment & Plan:

## 2015-09-06 NOTE — Progress Notes (Signed)
Pre visit review using our clinic review tool, if applicable. No additional management support is needed unless otherwise documented below in the visit note. 

## 2015-09-06 NOTE — Assessment & Plan Note (Signed)
BP stable on current medications.  Continue same. Obtain bmet.

## 2015-09-06 NOTE — Patient Instructions (Signed)
Please complete lab work prior to leaving.   

## 2015-09-07 ENCOUNTER — Telehealth: Payer: Self-pay | Admitting: Family

## 2015-09-07 LAB — BASIC METABOLIC PANEL
BUN: 11 mg/dL (ref 6–23)
CHLORIDE: 96 meq/L (ref 96–112)
CO2: 28 meq/L (ref 19–32)
CREATININE: 1.05 mg/dL (ref 0.40–1.50)
Calcium: 9.6 mg/dL (ref 8.4–10.5)
GFR: 74.6 mL/min (ref >60.00)
GLUCOSE: 103 mg/dL — AB (ref 70–99)
Potassium: 4.3 mEq/L (ref 3.5–5.1)
Sodium: 130 mEq/L — ABNORMAL LOW (ref 135–145)

## 2015-09-07 MED ORDER — AMLODIPINE BESYLATE 10 MG PO TABS: 10.0000 mg | ORAL_TABLET | Freq: Every day | ORAL | Status: DC

## 2015-09-07 NOTE — Telephone Encounter (Signed)
Yes, amlodipine 10, not lisinopril. Tks

## 2015-09-07 NOTE — Telephone Encounter (Signed)
Left message with pt's spouse to have pt return my call.

## 2015-09-07 NOTE — Telephone Encounter (Signed)
Sodium is quite low. Could be side effect of lisinopril.  I would like him to stop lisinopril, increase lisinopril from 5mg  to 10mg . Follow up in 2-3 weeks.

## 2015-09-07 NOTE — Telephone Encounter (Signed)
Gregory Brennan-- please advise that below documentation should be to increase amlodipine from 5 to 10mg , not lisinopril?

## 2015-09-08 DIAGNOSIS — J3081 Allergic rhinitis due to animal (cat) (dog) hair and dander: Secondary | ICD-10-CM | POA: Diagnosis not present

## 2015-09-08 DIAGNOSIS — J3089 Other allergic rhinitis: Secondary | ICD-10-CM | POA: Diagnosis not present

## 2015-09-08 DIAGNOSIS — J301 Allergic rhinitis due to pollen: Secondary | ICD-10-CM | POA: Diagnosis not present

## 2015-09-08 NOTE — Telephone Encounter (Signed)
Patient in need of clarification regarding message below. Scheduled 2 to 3 follow up for 09/27/2015 at 1:15pm   Best # [phone]

## 2015-09-08 NOTE — Telephone Encounter (Signed)
Notified pt and he voices understanding. 

## 2015-09-15 DIAGNOSIS — J3089 Other allergic rhinitis: Secondary | ICD-10-CM | POA: Diagnosis not present

## 2015-09-15 DIAGNOSIS — J301 Allergic rhinitis due to pollen: Secondary | ICD-10-CM | POA: Diagnosis not present

## 2015-09-17 DIAGNOSIS — J3089 Other allergic rhinitis: Secondary | ICD-10-CM | POA: Diagnosis not present

## 2015-09-17 DIAGNOSIS — J301 Allergic rhinitis due to pollen: Secondary | ICD-10-CM | POA: Diagnosis not present

## 2015-09-22 DIAGNOSIS — J301 Allergic rhinitis due to pollen: Secondary | ICD-10-CM | POA: Diagnosis not present

## 2015-09-22 DIAGNOSIS — J3089 Other allergic rhinitis: Secondary | ICD-10-CM | POA: Diagnosis not present

## 2015-09-22 DIAGNOSIS — J3081 Allergic rhinitis due to animal (cat) (dog) hair and dander: Secondary | ICD-10-CM | POA: Diagnosis not present

## 2015-09-24 DIAGNOSIS — J3089 Other allergic rhinitis: Secondary | ICD-10-CM | POA: Diagnosis not present

## 2015-09-24 DIAGNOSIS — J301 Allergic rhinitis due to pollen: Secondary | ICD-10-CM | POA: Diagnosis not present

## 2015-09-27 ENCOUNTER — Ambulatory Visit (INDEPENDENT_AMBULATORY_CARE_PROVIDER_SITE_OTHER): Payer: PPO | Admitting: Family

## 2015-09-27 ENCOUNTER — Encounter: Payer: Self-pay | Admitting: Family

## 2015-09-27 VITALS — BP 110/70 | HR 71 | Temp 98.0°F | Ht 71.5 in | Wt 176.2 lb

## 2015-09-27 DIAGNOSIS — I10 Essential (primary) hypertension: Secondary | ICD-10-CM | POA: Diagnosis not present

## 2015-09-27 DIAGNOSIS — E871 Hypo-osmolality and hyponatremia: Secondary | ICD-10-CM

## 2015-09-27 LAB — BASIC METABOLIC PANEL
BUN: 12 mg/dL (ref 6–23)
CHLORIDE: 99 meq/L (ref 96–112)
CO2: 26 mEq/L (ref 19–32)
Calcium: 9.4 mg/dL (ref 8.4–10.5)
Creatinine, Ser: 0.99 mg/dL (ref 0.40–1.50)
GFR: 79.83 mL/min (ref >60.00)
Glucose, Bld: 121 mg/dL — ABNORMAL HIGH (ref 70–99)
POTASSIUM: 3.9 meq/L (ref 3.5–5.1)
SODIUM: 132 meq/L — AB (ref 135–145)

## 2015-09-27 NOTE — Patient Instructions (Signed)
Please complete lab work prior to leaving.   

## 2015-09-27 NOTE — Progress Notes (Signed)
Pre visit review using our clinic review tool, if applicable. No additional management support is needed unless otherwise documented below in the visit note. 

## 2015-09-27 NOTE — Assessment & Plan Note (Signed)
Obtain follow up bmet off of lisinopril to re-evaluate.

## 2015-09-27 NOTE — Progress Notes (Signed)
   Subjective:    Patient ID: Gregory Brennan, male    DOB: 04-Mar-1946, 70 y.o.   MRN: [id number]  HPI  Gregory Brennan is a 70 yr old male who presents today for follow up of his hyponatremia. Last visit sodium was noted to be low.  It was felt to possibly be a side effect of lisinopril.  Lisinopril was d/c'd and his amlodipine dose was increased from 5mg  to 10mg . Patient reports that he has been working on a [employer] and treadmill intervals.  He does some kettlebell lifting.    BP Readings from Last 3 Encounters:  09/27/15 110/70  09/06/15 136/77  03/03/15 140/72    Review of Systems Past Medical History  Diagnosis Date  . Allergy   . Hypertension      Social History   Social History  . Marital Status: Married    Spouse Name: N/A  . Number of Children: N/A  . Years of Education: N/A   Occupational History  . Not on file.   Social History Main Topics  . Smoking status: Never Smoker   . Smokeless tobacco: Never Used  . Alcohol Use: No  . Drug Use: Not on file  . Sexual Activity: Not on file   Other Topics Concern  . 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 Abdominal hernia repair      Family History  Problem Relation Age of Onset  . Emphysema Mother   . Hypertension Father     Allergies  Allergen Reactions  . Penicillins Rash    Current Outpatient Prescriptions on File Prior to Visit  Medication Sig Dispense Refill  . amLODipine (NORVASC) 10 MG tablet Take 1 tablet (10 mg total) by mouth daily. 30 tablet 3  . cetirizine (ZYRTEC) 10 MG tablet Take 10 mg by mouth daily.    . fluticasone (FLONASE) 50 MCG/ACT nasal spray Place 1 spray into both nostrils daily as needed.       No current facility-administered medications on file prior to visit.    BP 110/70 mmHg  Pulse 71  Temp(Src) 98 F (36.7 C) (Oral)  Ht 5' 11.5" (1.816 m)  Wt 176 lb 3.2 oz (79.924 kg)  BMI 24.24 kg/m2  SpO2 98%       Objective:   Physical Exam  Constitutional: He is oriented to person, place, and time. He appears well-developed and well-nourished. No distress.  HENT:  Head: Normocephalic and atraumatic.  Cardiovascular: Normal rate and regular rhythm.   No murmur heard. Pulmonary/Chest: Effort normal and breath sounds normal. No respiratory distress. He has no wheezes. He has no rales.  Musculoskeletal: He exhibits no edema.  Neurological: He is alert and oriented to person, place, and time.  Skin: Skin is warm and dry.  Psychiatric: He has a normal mood and affect. His behavior is normal. Thought content normal.          Assessment & Plan:

## 2015-09-27 NOTE — Assessment & Plan Note (Signed)
BP looks good on current dose of amlodipine, continue same.  

## 2015-09-29 ENCOUNTER — Telehealth: Payer: Self-pay | Admitting: Family

## 2015-09-29 DIAGNOSIS — R739 Hyperglycemia, unspecified: Secondary | ICD-10-CM

## 2015-09-29 DIAGNOSIS — E871 Hypo-osmolality and hyponatremia: Secondary | ICD-10-CM

## 2015-09-29 DIAGNOSIS — J3089 Other allergic rhinitis: Secondary | ICD-10-CM | POA: Diagnosis not present

## 2015-09-29 DIAGNOSIS — J301 Allergic rhinitis due to pollen: Secondary | ICD-10-CM | POA: Diagnosis not present

## 2015-09-29 NOTE — Telephone Encounter (Signed)
Called and spoke with the pt and informed him of recent lab results and note.  Pt verbalized understanding and agreed.  Pt was scheduled a lab appt for (Mon-10/04/15 @ 1:15pm).  Future lab ordered.//AB/CMA

## 2015-09-29 NOTE — Telephone Encounter (Signed)
Please contact patient and let him know that I reviewed his lab work and his sodium is still low.  I would like him to return for some additional testing which is pended.  Sugar is also mildly elevated and I am going to add on a diabetes screening test.

## 2015-10-04 ENCOUNTER — Other Ambulatory Visit (INDEPENDENT_AMBULATORY_CARE_PROVIDER_SITE_OTHER): Payer: PPO

## 2015-10-04 DIAGNOSIS — E871 Hypo-osmolality and hyponatremia: Secondary | ICD-10-CM | POA: Diagnosis not present

## 2015-10-04 DIAGNOSIS — R739 Hyperglycemia, unspecified: Secondary | ICD-10-CM

## 2015-10-04 LAB — HEMOGLOBIN A1C: Hgb A1c MFr Bld: 5.3 % (ref 4.6–6.5)

## 2015-10-05 LAB — OSMOLALITY: Osmolality: 275 mOsm/kg — ABNORMAL LOW (ref 278–305)

## 2015-10-05 LAB — SODIUM, URINE, RANDOM: Sodium, Ur: 17 mmol/L — ABNORMAL LOW (ref 28–272)

## 2015-10-05 LAB — OSMOLALITY, URINE: Osmolality, Ur: 323 mOsm/kg (ref 50–1200)

## 2015-10-06 DIAGNOSIS — J3089 Other allergic rhinitis: Secondary | ICD-10-CM | POA: Diagnosis not present

## 2015-10-06 DIAGNOSIS — J301 Allergic rhinitis due to pollen: Secondary | ICD-10-CM | POA: Diagnosis not present

## 2015-10-11 ENCOUNTER — Telehealth: Payer: Self-pay | Admitting: Family

## 2015-10-11 DIAGNOSIS — E871 Hypo-osmolality and hyponatremia: Secondary | ICD-10-CM

## 2015-10-11 NOTE — Telephone Encounter (Signed)
Pt notified. Lab appt scheduled for 01/11/16 and future order entered.

## 2015-10-11 NOTE — Telephone Encounter (Signed)
Reviewed lab work. Sodium is still a bit low.  Lab work did not conclude for sure why sodium is low. I would recommend that he avoid overhydrating, don't drink more than 60 oz of fluid a day unless he is really sweating and losing fluid.  Repeat bmet in 3 months, dx hyponatremia.

## 2015-10-13 DIAGNOSIS — J301 Allergic rhinitis due to pollen: Secondary | ICD-10-CM | POA: Diagnosis not present

## 2015-10-13 DIAGNOSIS — J3089 Other allergic rhinitis: Secondary | ICD-10-CM | POA: Diagnosis not present

## 2015-10-20 DIAGNOSIS — J3081 Allergic rhinitis due to animal (cat) (dog) hair and dander: Secondary | ICD-10-CM | POA: Diagnosis not present

## 2015-10-20 DIAGNOSIS — J3089 Other allergic rhinitis: Secondary | ICD-10-CM | POA: Diagnosis not present

## 2015-10-20 DIAGNOSIS — J301 Allergic rhinitis due to pollen: Secondary | ICD-10-CM | POA: Diagnosis not present

## 2015-10-26 DIAGNOSIS — J3081 Allergic rhinitis due to animal (cat) (dog) hair and dander: Secondary | ICD-10-CM | POA: Diagnosis not present

## 2015-10-26 DIAGNOSIS — J301 Allergic rhinitis due to pollen: Secondary | ICD-10-CM | POA: Diagnosis not present

## 2015-10-26 DIAGNOSIS — J3089 Other allergic rhinitis: Secondary | ICD-10-CM | POA: Diagnosis not present

## 2015-11-02 DIAGNOSIS — J301 Allergic rhinitis due to pollen: Secondary | ICD-10-CM | POA: Diagnosis not present

## 2015-11-02 DIAGNOSIS — J3089 Other allergic rhinitis: Secondary | ICD-10-CM | POA: Diagnosis not present

## 2015-11-09 DIAGNOSIS — J301 Allergic rhinitis due to pollen: Secondary | ICD-10-CM | POA: Diagnosis not present

## 2015-11-09 DIAGNOSIS — J3089 Other allergic rhinitis: Secondary | ICD-10-CM | POA: Diagnosis not present

## 2015-11-11 ENCOUNTER — Encounter: Payer: Self-pay | Admitting: Family

## 2015-11-12 NOTE — Telephone Encounter (Signed)
I would verify that he has no SOB, lightheadedness, palpitations. If yes then he needs to be seen today. If not, then he can come see me today if he wants or follow-up with Melissa next week. I have looked at the attached pictures and can see the swelling of feet and ankles. I also see he was recently taken off of lisinopril and his amlodipine was increased from 5 mg to 10 mg to compensate. BP looks good, but amlodipine can cause foot and ankle swelling. This very well could be the culprit but he would need assessment first.

## 2015-11-12 NOTE — Telephone Encounter (Signed)
Left message for pt to return my call.

## 2015-11-12 NOTE — Telephone Encounter (Signed)
Gregory Brennan-- please advise if ok for pt to wait and see Melissa next week? Thanks!

## 2015-11-12 NOTE — Telephone Encounter (Signed)
Spoke with pt. He denies below symptoms and wishes to be seen next week. Appt scheduled for Monday at 3:45pm with Melissa.

## 2015-11-15 ENCOUNTER — Ambulatory Visit (INDEPENDENT_AMBULATORY_CARE_PROVIDER_SITE_OTHER): Payer: PPO | Admitting: Family

## 2015-11-15 ENCOUNTER — Encounter: Payer: Self-pay | Admitting: Family

## 2015-11-15 DIAGNOSIS — I10 Essential (primary) hypertension: Secondary | ICD-10-CM | POA: Diagnosis not present

## 2015-11-15 DIAGNOSIS — Z23 Encounter for immunization: Secondary | ICD-10-CM

## 2015-11-15 NOTE — Assessment & Plan Note (Addendum)
BP stable. He does not wish to discontinue amlodipine since it is controlling his blood pressure.  Advised pt OK to continue amlodipine, this is a common side effect.

## 2015-11-15 NOTE — Progress Notes (Signed)
   Subjective:    Patient ID: Gregory Brennan, male    DOB: February 14, 1946, 70 y.o.   MRN: [id number]  HPI  Gregory Brennan is a 70 yr old male who presents today for follow up of his HTN. Last visit his amlodipine was increased form 5mg  to 10mg .  He notes increase LE edema since dose was increased. Notes BP has been well controlled.  Denies SOB or CP or dizziness. Does not feel that the edema is really bothering him.    BP Readings from Last 3 Encounters:  11/15/15 116/74  09/27/15 110/70  09/06/15 136/77     Review of Systems See HPI  Past Medical History:  Diagnosis Date  . Allergy   . Hypertension      Social History   Social History  . Marital status: Married    Spouse name: N/A  . Number of children: N/A  . Years of education: N/A   Occupational History  . Not on file.   Social History Main Topics  . Smoking status: Never Smoker  . Smokeless tobacco: Never Used  . Alcohol use No  . Drug use: Unknown  . Sexual activity: Not on file   Other Topics Concern  . 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 ABDOMINAL HERNIA REPAIR      Family History  Problem Relation Age of Onset  . Emphysema Mother   . Hypertension Father     Allergies  Allergen Reactions  . Penicillins Rash    Current Outpatient Prescriptions on File Prior to Visit  Medication Sig Dispense Refill  . amLODipine (NORVASC) 10 MG tablet Take 1 tablet (10 mg total) by mouth daily. 30 tablet 3  . cetirizine (ZYRTEC) 10 MG tablet Take 10 mg by mouth daily.    . fluticasone (FLONASE) 50 MCG/ACT nasal spray Place 1 spray into both nostrils daily as needed.      No current facility-administered medications on file  prior to visit.     BP 116/74 (BP Location: Right Arm, Patient Position: Sitting, Cuff Size: Normal)   Pulse 83   Temp 98.3 F (36.8 C) (Oral)   Resp 16   Ht 5\' 11"  (1.803 m)   Wt 173 lb 3.2 oz (78.6 kg)   SpO2 97%   BMI 24.16 kg/m       Objective:   Physical Exam  Constitutional: He is oriented to person, place, and time. He appears well-developed and well-nourished. No distress.  HENT:  Head: Normocephalic and atraumatic.  Cardiovascular: Normal rate and regular rhythm.   No murmur heard. Pulmonary/Chest: Effort normal and breath sounds normal. No respiratory distress. He has no wheezes. He has no rales.  Musculoskeletal:  1+ bilateral LE edema  Neurological: He is alert and oriented to person, place, and time.  Skin: Skin is warm and dry.  Psychiatric: He has a normal mood and affect. His behavior is normal. Thought content normal.          Assessment & Plan:

## 2015-11-15 NOTE — Progress Notes (Signed)
Pre visit review using our clinic review tool, if applicable. No additional management support is needed unless otherwise documented below in the visit note. 

## 2015-11-16 DIAGNOSIS — J3089 Other allergic rhinitis: Secondary | ICD-10-CM | POA: Diagnosis not present

## 2015-11-16 DIAGNOSIS — J3081 Allergic rhinitis due to animal (cat) (dog) hair and dander: Secondary | ICD-10-CM | POA: Diagnosis not present

## 2015-11-16 DIAGNOSIS — J301 Allergic rhinitis due to pollen: Secondary | ICD-10-CM | POA: Diagnosis not present

## 2015-11-23 ENCOUNTER — Encounter: Payer: Self-pay | Admitting: Family

## 2015-11-23 DIAGNOSIS — J301 Allergic rhinitis due to pollen: Secondary | ICD-10-CM | POA: Diagnosis not present

## 2015-11-23 DIAGNOSIS — J3089 Other allergic rhinitis: Secondary | ICD-10-CM | POA: Diagnosis not present

## 2015-11-23 DIAGNOSIS — J3081 Allergic rhinitis due to animal (cat) (dog) hair and dander: Secondary | ICD-10-CM | POA: Diagnosis not present

## 2015-11-25 MED ORDER — METOPROLOL SUCCINATE ER 50 MG PO TB24: 50.0000 mg | ORAL_TABLET | Freq: Every day | ORAL | 3 refills | Status: DC

## 2015-11-30 DIAGNOSIS — J3089 Other allergic rhinitis: Secondary | ICD-10-CM | POA: Diagnosis not present

## 2015-11-30 DIAGNOSIS — J301 Allergic rhinitis due to pollen: Secondary | ICD-10-CM | POA: Diagnosis not present

## 2015-11-30 DIAGNOSIS — J3081 Allergic rhinitis due to animal (cat) (dog) hair and dander: Secondary | ICD-10-CM | POA: Diagnosis not present

## 2015-12-07 DIAGNOSIS — J301 Allergic rhinitis due to pollen: Secondary | ICD-10-CM | POA: Diagnosis not present

## 2015-12-07 DIAGNOSIS — J3089 Other allergic rhinitis: Secondary | ICD-10-CM | POA: Diagnosis not present

## 2015-12-14 DIAGNOSIS — J3089 Other allergic rhinitis: Secondary | ICD-10-CM | POA: Diagnosis not present

## 2015-12-14 DIAGNOSIS — J301 Allergic rhinitis due to pollen: Secondary | ICD-10-CM | POA: Diagnosis not present

## 2015-12-18 ENCOUNTER — Other Ambulatory Visit: Payer: Self-pay | Admitting: Family

## 2015-12-21 DIAGNOSIS — J3081 Allergic rhinitis due to animal (cat) (dog) hair and dander: Secondary | ICD-10-CM | POA: Diagnosis not present

## 2015-12-21 DIAGNOSIS — J301 Allergic rhinitis due to pollen: Secondary | ICD-10-CM | POA: Diagnosis not present

## 2015-12-21 DIAGNOSIS — J3089 Other allergic rhinitis: Secondary | ICD-10-CM | POA: Diagnosis not present

## 2015-12-28 DIAGNOSIS — J301 Allergic rhinitis due to pollen: Secondary | ICD-10-CM | POA: Diagnosis not present

## 2015-12-28 DIAGNOSIS — J3089 Other allergic rhinitis: Secondary | ICD-10-CM | POA: Diagnosis not present

## 2015-12-28 DIAGNOSIS — J3081 Allergic rhinitis due to animal (cat) (dog) hair and dander: Secondary | ICD-10-CM | POA: Diagnosis not present

## 2015-12-30 DIAGNOSIS — J3089 Other allergic rhinitis: Secondary | ICD-10-CM | POA: Diagnosis not present

## 2015-12-30 DIAGNOSIS — J301 Allergic rhinitis due to pollen: Secondary | ICD-10-CM | POA: Diagnosis not present

## 2016-01-04 DIAGNOSIS — J301 Allergic rhinitis due to pollen: Secondary | ICD-10-CM | POA: Diagnosis not present

## 2016-01-04 DIAGNOSIS — J3089 Other allergic rhinitis: Secondary | ICD-10-CM | POA: Diagnosis not present

## 2016-01-11 ENCOUNTER — Other Ambulatory Visit (INDEPENDENT_AMBULATORY_CARE_PROVIDER_SITE_OTHER): Payer: PPO

## 2016-01-11 DIAGNOSIS — E871 Hypo-osmolality and hyponatremia: Secondary | ICD-10-CM | POA: Diagnosis not present

## 2016-01-11 DIAGNOSIS — J3089 Other allergic rhinitis: Secondary | ICD-10-CM | POA: Diagnosis not present

## 2016-01-11 DIAGNOSIS — J301 Allergic rhinitis due to pollen: Secondary | ICD-10-CM | POA: Diagnosis not present

## 2016-01-11 LAB — BASIC METABOLIC PANEL
BUN: 18 mg/dL (ref 6–23)
CALCIUM: 9.4 mg/dL (ref 8.4–10.5)
CO2: 31 mEq/L (ref 19–32)
Chloride: 97 mEq/L (ref 96–112)
Creatinine, Ser: 1.12 mg/dL (ref 0.40–1.50)
GFR: 69.18 mL/min (ref >60.00)
GLUCOSE: 65 mg/dL — AB (ref 70–99)
POTASSIUM: 4.5 meq/L (ref 3.5–5.1)
SODIUM: 133 meq/L — AB (ref 135–145)

## 2016-01-18 DIAGNOSIS — J301 Allergic rhinitis due to pollen: Secondary | ICD-10-CM | POA: Diagnosis not present

## 2016-01-18 DIAGNOSIS — J3089 Other allergic rhinitis: Secondary | ICD-10-CM | POA: Diagnosis not present

## 2016-01-25 DIAGNOSIS — J3089 Other allergic rhinitis: Secondary | ICD-10-CM | POA: Diagnosis not present

## 2016-01-25 DIAGNOSIS — J3081 Allergic rhinitis due to animal (cat) (dog) hair and dander: Secondary | ICD-10-CM | POA: Diagnosis not present

## 2016-01-25 DIAGNOSIS — J301 Allergic rhinitis due to pollen: Secondary | ICD-10-CM | POA: Diagnosis not present

## 2016-02-01 DIAGNOSIS — J3089 Other allergic rhinitis: Secondary | ICD-10-CM | POA: Diagnosis not present

## 2016-02-01 DIAGNOSIS — J301 Allergic rhinitis due to pollen: Secondary | ICD-10-CM | POA: Diagnosis not present

## 2016-02-08 DIAGNOSIS — J301 Allergic rhinitis due to pollen: Secondary | ICD-10-CM | POA: Diagnosis not present

## 2016-02-08 DIAGNOSIS — J3089 Other allergic rhinitis: Secondary | ICD-10-CM | POA: Diagnosis not present

## 2016-02-15 DIAGNOSIS — J301 Allergic rhinitis due to pollen: Secondary | ICD-10-CM | POA: Diagnosis not present

## 2016-02-15 DIAGNOSIS — J3089 Other allergic rhinitis: Secondary | ICD-10-CM | POA: Diagnosis not present

## 2016-02-17 DIAGNOSIS — J3089 Other allergic rhinitis: Secondary | ICD-10-CM | POA: Diagnosis not present

## 2016-02-17 DIAGNOSIS — J301 Allergic rhinitis due to pollen: Secondary | ICD-10-CM | POA: Diagnosis not present

## 2016-02-23 DIAGNOSIS — J3081 Allergic rhinitis due to animal (cat) (dog) hair and dander: Secondary | ICD-10-CM | POA: Diagnosis not present

## 2016-02-23 DIAGNOSIS — J3089 Other allergic rhinitis: Secondary | ICD-10-CM | POA: Diagnosis not present

## 2016-02-23 DIAGNOSIS — J301 Allergic rhinitis due to pollen: Secondary | ICD-10-CM | POA: Diagnosis not present

## 2016-02-29 DIAGNOSIS — J3089 Other allergic rhinitis: Secondary | ICD-10-CM | POA: Diagnosis not present

## 2016-02-29 DIAGNOSIS — J301 Allergic rhinitis due to pollen: Secondary | ICD-10-CM | POA: Diagnosis not present

## 2016-03-02 ENCOUNTER — Other Ambulatory Visit: Payer: Self-pay | Admitting: Family

## 2016-03-07 ENCOUNTER — Ambulatory Visit (INDEPENDENT_AMBULATORY_CARE_PROVIDER_SITE_OTHER): Payer: PPO | Admitting: Family

## 2016-03-07 ENCOUNTER — Encounter: Payer: Self-pay | Admitting: Family

## 2016-03-07 VITALS — BP 156/73 | HR 54 | Temp 97.8°F | Resp 18 | Ht 71.0 in | Wt 179.2 lb

## 2016-03-07 DIAGNOSIS — I10 Essential (primary) hypertension: Secondary | ICD-10-CM

## 2016-03-07 DIAGNOSIS — J301 Allergic rhinitis due to pollen: Secondary | ICD-10-CM | POA: Diagnosis not present

## 2016-03-07 DIAGNOSIS — J3089 Other allergic rhinitis: Secondary | ICD-10-CM | POA: Diagnosis not present

## 2016-03-07 MED ORDER — METOPROLOL SUCCINATE ER 50 MG PO TB24: ORAL_TABLET | ORAL | 3 refills | Status: DC

## 2016-03-07 NOTE — Progress Notes (Signed)
   Subjective:    Patient ID: Gregory Brennan, male    DOB: Sep 13, 1945, 70 y.o.   MRN: [id number]  HPI  Gregory Brennan is a 70 yr old male who presents today for follow up of his blood pressure. Since his last visit his amlodipine was changed to toprol 50mg  because he was bothered by the lower ext edema on the amlodipine. Reports that he is tolerating the toprol without difficulty.   BP Readings from Last 3 Encounters:  03/07/16 (!) 156/73  11/15/15 116/74  09/27/15 110/70      Review of Systems See HPI  Past Medical History:  Diagnosis Date  . Allergy   . Hypertension      Social History   Social History  . Marital status: Married    Spouse name: N/A  . Number of children: N/A  . Years of education: N/A   Occupational History  . Not on file.   Social History Main Topics  . Smoking status: Never Smoker  . Smokeless tobacco: Never Used  . Alcohol use No  . Drug use: Unknown  . Sexual activity: Not on file   Other Topics Concern  . 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 ABDOMINAL HERNIA REPAIR      Family History  Problem Relation Age of Onset  . Emphysema Mother   . Hypertension Father     Allergies  Allergen Reactions  . Penicillins Rash    Current Outpatient Prescriptions on File Prior to Visit  Medication Sig Dispense Refill  . cetirizine (ZYRTEC) 10 MG tablet Take 10 mg by mouth daily.    . fluticasone (FLONASE) 50 MCG/ACT nasal spray Place 1 spray into both nostrils daily as needed.      No current facility-administered medications on file prior to visit.     BP (!) 156/73 (BP Location: Right Arm, Cuff Size: Normal)   Pulse (!) 54   Temp 97.8 F (36.6 C)  (Oral)   Resp 18   Ht 5\' 11"  (1.803 m)   Wt 179 lb 3.2 oz (81.3 kg)   SpO2 100% Comment: room air  BMI 24.99 kg/m       Objective:   Physical Exam  Constitutional: He is oriented to person, place, and time. He appears well-developed and well-nourished. No distress.  HENT:  Head: Normocephalic and atraumatic.  Cardiovascular: Normal rate and regular rhythm.   No murmur heard. Pulmonary/Chest: Effort normal and breath sounds normal. No respiratory distress. He has no wheezes. He has no rales.  Musculoskeletal: He exhibits no edema.  Neurological: He is alert and oriented to person, place, and time.  Skin: Skin is warm and dry.  Psychiatric: He has a normal mood and affect. His behavior is normal. Thought content normal.          Assessment & Plan:  HTN- uncontrolled. Increase toprol xl from 50mg  to 75mg .

## 2016-03-07 NOTE — Progress Notes (Signed)
Pre visit review using our clinic review tool, if applicable. No additional management support is needed unless otherwise documented below in the visit note. 

## 2016-03-07 NOTE — Patient Instructions (Addendum)
Increase toprol xl from 1 tab to 1.5 tabs once daily. Please check blood pressure once daily at home and send me your readings after 03/21/15.

## 2016-03-15 DIAGNOSIS — J3081 Allergic rhinitis due to animal (cat) (dog) hair and dander: Secondary | ICD-10-CM | POA: Diagnosis not present

## 2016-03-16 DIAGNOSIS — J3089 Other allergic rhinitis: Secondary | ICD-10-CM | POA: Diagnosis not present

## 2016-03-16 DIAGNOSIS — J301 Allergic rhinitis due to pollen: Secondary | ICD-10-CM | POA: Diagnosis not present

## 2016-03-23 DIAGNOSIS — J3089 Other allergic rhinitis: Secondary | ICD-10-CM | POA: Diagnosis not present

## 2016-03-23 DIAGNOSIS — J301 Allergic rhinitis due to pollen: Secondary | ICD-10-CM | POA: Diagnosis not present

## 2016-03-24 ENCOUNTER — Encounter: Payer: Self-pay | Admitting: Family

## 2016-03-30 DIAGNOSIS — J301 Allergic rhinitis due to pollen: Secondary | ICD-10-CM | POA: Diagnosis not present

## 2016-03-30 DIAGNOSIS — J3081 Allergic rhinitis due to animal (cat) (dog) hair and dander: Secondary | ICD-10-CM | POA: Diagnosis not present

## 2016-03-30 DIAGNOSIS — J3089 Other allergic rhinitis: Secondary | ICD-10-CM | POA: Diagnosis not present

## 2016-04-04 ENCOUNTER — Telehealth: Payer: Self-pay | Admitting: Family

## 2016-04-04 NOTE — Telephone Encounter (Signed)
Spoke with patient to schedule awv. Patient asked if he could receive a call back on 04/07/2016 to schedule appt.

## 2016-04-13 DIAGNOSIS — J301 Allergic rhinitis due to pollen: Secondary | ICD-10-CM | POA: Diagnosis not present

## 2016-04-13 DIAGNOSIS — J3089 Other allergic rhinitis: Secondary | ICD-10-CM | POA: Diagnosis not present

## 2016-04-18 DIAGNOSIS — J301 Allergic rhinitis due to pollen: Secondary | ICD-10-CM | POA: Diagnosis not present

## 2016-04-18 DIAGNOSIS — J3081 Allergic rhinitis due to animal (cat) (dog) hair and dander: Secondary | ICD-10-CM | POA: Diagnosis not present

## 2016-04-18 DIAGNOSIS — J3089 Other allergic rhinitis: Secondary | ICD-10-CM | POA: Diagnosis not present

## 2016-04-25 DIAGNOSIS — J301 Allergic rhinitis due to pollen: Secondary | ICD-10-CM | POA: Diagnosis not present

## 2016-04-25 DIAGNOSIS — J3089 Other allergic rhinitis: Secondary | ICD-10-CM | POA: Diagnosis not present

## 2016-04-25 DIAGNOSIS — J3081 Allergic rhinitis due to animal (cat) (dog) hair and dander: Secondary | ICD-10-CM | POA: Diagnosis not present

## 2016-05-01 NOTE — Progress Notes (Signed)
Pre visit review using our clinic review tool, if applicable. No additional management support is needed unless otherwise documented below in the visit note. 

## 2016-05-01 NOTE — Progress Notes (Addendum)
Subjective:   Gregory Brennan is a 71 y.o. male who presents for Medicare Annual/Subsequent preventive examination.  Review of Systems:  No ROS.  Medicare Wellness Visit.  Cardiac Risk Factors include: advanced age (>63men, >6 women);male gender;hypertension  Sleep patterns: has frequent nighttime awakenings, feels rested on waking and does not get up to void.   Home Safety/Smoke Alarms: Feels safe in home. Smoke alarms in place. Security system in place. Living environment; residence and Firearm Safety: Lives w/ wife. 2-story house, no firearms. No issues navigating stairs. Seat Belt Safety/Bike Helmet: Wears seat belt. Wears bike helmet.   Counseling:   Eye Exam- Dr. Domingo Cocking yearly Dental- Dr. Vidal Schwalbe in [place] every 6 months  Male:   CCS- last 03/20/08. No report on file. Had done at [facility] in [place], [place]. Pt thinks he has a copy of report at home and will bring to next appt.      PSA- Not on file.    Objective:    Vitals: BP (!) 164/92   Pulse (!) 53   Resp 16   Ht 5\' 11"  (1.803 m)   Wt 179 lb 6.4 oz (81.4 kg)   SpO2 99%   BMI 25.02 kg/m   Body mass index is 25.02 kg/m.  Wt Readings from Last 3 Encounters:  05/02/16 179 lb 6.4 oz (81.4 kg)  03/07/16 179 lb 3.2 oz (81.3 kg)  11/15/15 173 lb 3.2 oz (78.6 kg)   BP Readings from Last 3 Encounters:  05/02/16 (!) 164/92  03/07/16 (!) 156/73  11/15/15 116/74    Tobacco History  Smoking Status  . Never Smoker  Smokeless Tobacco  . Never Used     Counseling given: Not Answered   Past Medical History:  Diagnosis Date  . Allergy   . Hypertension    Past Surgical History:  Procedure Laterality Date  . ABDOMINAL HERNIA REPAIR     Family History  Problem Relation Age of Onset  . Emphysema Mother   . Hypertension Father    History  Sexual Activity  . Sexual activity: Yes    Outpatient Encounter Prescriptions as of 05/02/2016  Medication Sig  . cetirizine (ZYRTEC) 10 MG tablet Take 10 mg  by mouth daily.  . fluticasone (FLONASE) 50 MCG/ACT nasal spray Place 1 spray into both nostrils daily as needed.   . metoprolol succinate (TOPROL-XL) 50 MG 24 hr tablet Take 1.5 tablets by mouth once daily   No facility-administered encounter medications on file as of 05/02/2016.     Activities of Daily Living In your present state of health, do you have any difficulty performing the following activities: 05/02/2016 09/27/2015  Hearing? N N  Vision? N N  Difficulty concentrating or making decisions? N N  Walking or climbing stairs? N N  Dressing or bathing? N N  Doing errands, shopping? N N  Preparing Food and eating ? N -  Using the Toilet? N -  In the past six months, have you accidently leaked urine? N -  Do you have problems with loss of bowel control? N -  Managing your Medications? N -  Managing your Finances? N -  Housekeeping or managing your Housekeeping? N -  Some recent data might be hidden    Patient Care Team: Debbrah Alar, NP as PCP - General (Internal Medicine)   Assessment:    Physical assessment deferred to PCP.  Exercise Activities and Dietary recommendations Current Exercise Habits: Structured exercise class, Type of exercise: strength training/weights;treadmill, Frequency (  Times/Week): 6, Intensity: Moderate  Diet (meal preparation, eat out, water intake, caffeinated beverages, dairy products, fruits and vegetables): in general, a "healthy" diet  , well balanced, on average, 2 meals per day. Meals vary, eats most meals at home. Does not eat much meat, but chicken/fish a few times weekly.  Breakfast: Stone ground oatmeal w/ banana and blueberries Snack: handful of almonds and raw veggies Lunch: skips Snack: 1-2 apples Dinner: meals vary-baked chicken/fish  Goals    . Blood Pressure < 140/90      Fall Risk Fall Risk  03/07/2016 09/06/2015 03/03/2015 06/01/2014  Falls in the past year? No No No No   Depression Screen PHQ 2/9 Scores 03/07/2016  09/06/2015 03/03/2015 06/01/2014  PHQ - 2 Score 0 0 0 0    Cognitive Function       Ad8 score reviewed for issues:  Issues making decisions: No  Less interest in hobbies / activities: No  Repeats questions, stories (family complaining): No  Trouble using ordinary gadgets (microwave, computer, phone): No  Forgets the month or year: No  Mismanaging finances: No  Remembering appts: No  Daily problems with thinking and/or memory: No Ad8 score is= 0  Immunization History  Administered Date(s) Administered  . Influenza, High Dose Seasonal PF 01/29/2015  . Influenza,inj,Quad PF,36+ Mos 12/29/2013, 11/15/2015  . Pneumococcal Conjugate-13 01/26/2014  . Pneumococcal Polysaccharide-23 01/29/2015  . Tdap 03/20/2008  . Zoster 01/29/2015   Screening Tests Health Maintenance  Topic Date Due  . Hepatitis C Screening  09/05/2016 (Originally [birth year])  . TETANUS/TDAP  03/20/2018  . COLONOSCOPY  03/21/2019  . INFLUENZA VACCINE  Completed  . ZOSTAVAX  Completed  . PNA vac Low Risk Adult  Completed      Plan:   Follow-up w/ PCP as scheduled for CPE and labs.  Bring home BP cuff to next appt for correlation.   Continue to eat heart healthy diet (full of fruits, vegetables, whole grains, lean protein, water--limit salt, fat, and sugar intake) and increase physical activity as tolerated.  During the course of the visit the patient was educated and counseled about the following appropriate screening and preventive services:   Vaccines to include Pneumoccal, Influenza, Hepatitis B, Td, Zostavax, HCV  Cardiovascular Disease  Colorectal cancer screening  Diabetes screening  Glaucoma screening  Nutrition counseling    Patient Instructions (the written plan) was given to the patient.    [name], RN  05/02/2016  [name], MD

## 2016-05-02 ENCOUNTER — Ambulatory Visit (INDEPENDENT_AMBULATORY_CARE_PROVIDER_SITE_OTHER): Payer: PPO | Admitting: *Deleted

## 2016-05-02 ENCOUNTER — Encounter: Payer: Self-pay | Admitting: *Deleted

## 2016-05-02 VITALS — BP 164/92 | HR 53 | Resp 16 | Ht 71.0 in | Wt 179.4 lb

## 2016-05-02 DIAGNOSIS — Z0000 Encounter for general adult medical examination without abnormal findings: Secondary | ICD-10-CM | POA: Diagnosis not present

## 2016-05-02 DIAGNOSIS — J3081 Allergic rhinitis due to animal (cat) (dog) hair and dander: Secondary | ICD-10-CM | POA: Diagnosis not present

## 2016-05-02 DIAGNOSIS — I10 Essential (primary) hypertension: Secondary | ICD-10-CM | POA: Diagnosis not present

## 2016-05-02 DIAGNOSIS — J3089 Other allergic rhinitis: Secondary | ICD-10-CM | POA: Diagnosis not present

## 2016-05-02 DIAGNOSIS — J301 Allergic rhinitis due to pollen: Secondary | ICD-10-CM | POA: Diagnosis not present

## 2016-05-02 NOTE — Assessment & Plan Note (Signed)
BP uncontrolled today. Pt reports compliance w/ medication. Was previously taking amlodipine and felt BP was better controlled, but had bothersome swelling on this. He is open to trying other medication if BP continues to be uncontrolled on Toprol. He is not having any HA, blurred vision, etc. He is checking BP at home and reports home readings average 130s-140s/70s. CPE scheduled this week w/ PCP for follow-up.

## 2016-05-02 NOTE — Patient Instructions (Addendum)
Gregory Brennan , Thank you for taking time to come for your Medicare Wellness Visit. I appreciate your ongoing commitment to your health goals. Please review the following plan we discussed and let me know if I can assist you in the future.   Bring a copy of your advance directives and your last colonoscopy report to your next office visit. Bring your home BP cuff to your next office visit to compare readings.  These are the goals we discussed: Goals    . Blood Pressure < 140/90       This is a list of the screening recommended for you and due dates:  Health Maintenance  Topic Date Due  .  Hepatitis C: One time screening is recommended by Center for Disease Control  (CDC) for  adults born from [birth year] through 1963.   09/05/2016*  . Tetanus Vaccine  03/20/2018  . Colon Cancer Screening  03/21/2019  . Flu Shot  Completed  . Shingles Vaccine  Completed  . Pneumonia vaccines  Completed  *Topic was postponed. The date shown is not the original due date.

## 2016-05-05 ENCOUNTER — Ambulatory Visit (INDEPENDENT_AMBULATORY_CARE_PROVIDER_SITE_OTHER): Payer: PPO | Admitting: Family

## 2016-05-05 ENCOUNTER — Encounter: Payer: Self-pay | Admitting: Family

## 2016-05-05 VITALS — BP 146/71 | HR 47 | Temp 97.8°F | Ht 71.0 in | Wt 177.2 lb

## 2016-05-05 DIAGNOSIS — Z0000 Encounter for general adult medical examination without abnormal findings: Secondary | ICD-10-CM

## 2016-05-05 DIAGNOSIS — Z125 Encounter for screening for malignant neoplasm of prostate: Secondary | ICD-10-CM | POA: Diagnosis not present

## 2016-05-05 DIAGNOSIS — I10 Essential (primary) hypertension: Secondary | ICD-10-CM | POA: Diagnosis not present

## 2016-05-05 DIAGNOSIS — K635 Polyp of colon: Secondary | ICD-10-CM

## 2016-05-05 LAB — BASIC METABOLIC PANEL
BUN: 14 mg/dL (ref 6–23)
CHLORIDE: 99 meq/L (ref 96–112)
CO2: 27 meq/L (ref 19–32)
Calcium: 9.1 mg/dL (ref 8.4–10.5)
Creatinine, Ser: 1.07 mg/dL (ref 0.40–1.50)
GFR: 72.85 mL/min (ref >60.00)
Glucose, Bld: 112 mg/dL — ABNORMAL HIGH (ref 70–99)
Potassium: 4.1 mEq/L (ref 3.5–5.1)
Sodium: 134 mEq/L — ABNORMAL LOW (ref 135–145)

## 2016-05-05 LAB — PSA, MEDICARE: PSA: 0.78 ng/ml (ref 0.10–4.00)

## 2016-05-05 MED ORDER — LOSARTAN POTASSIUM 25 MG PO TABS: 25.0000 mg | ORAL_TABLET | Freq: Every day | ORAL | 2 refills | Status: DC

## 2016-05-05 MED ORDER — METOPROLOL SUCCINATE ER 50 MG PO TB24: 50.0000 mg | ORAL_TABLET | Freq: Every day | ORAL | Status: DC

## 2016-05-05 NOTE — Progress Notes (Signed)
Pre visit review using our clinic review tool, if applicable. No additional management support is needed unless otherwise documented below in the visit note. 

## 2016-05-05 NOTE — Patient Instructions (Signed)
Decrease metoprolol to 1 tab once daily. Add losartan 25mg  once daily.

## 2016-05-05 NOTE — Progress Notes (Signed)
Subjective:    Patient ID: Gregory Brennan, male    DOB: [birth year], 71 y.o.   MRN: [id number]  HPI  Patient presents today for complete physical.  Immunizations: up to date.  Diet: health diet Exercise:  Regular exercise Colonoscopy:2011  BP Readings from Last 3 Encounters:  05/05/16 (!) 146/71  05/02/16 (!) 164/92  03/07/16 (!) 156/73    Review of Systems  Constitutional: Negative for unexpected weight change.  HENT: Negative for hearing loss and rhinorrhea.   Eyes: Negative for visual disturbance.  Respiratory: Negative for cough.   Cardiovascular: Negative for leg swelling.  Gastrointestinal: Negative for constipation and diarrhea.  Genitourinary: Negative for dysuria and frequency.  Musculoskeletal: Negative for arthralgias and myalgias.  Neurological: Negative for headaches.  Hematological: Negative for adenopathy.  Psychiatric/Behavioral:       Denies depression/anxiety   Past Medical History:  Diagnosis Date  . Allergy   . Hypertension      Social History   Social History  . Marital status: Married    Spouse name: N/A  . Number of children: N/A  . Years of education: N/A   Occupational History  . Not on file.   Social History Main Topics  . Smoking status: Never Smoker  . Smokeless tobacco: Never Used  . Alcohol use No  . Drug use: No  . Sexual activity: Yes   Other Topics Concern  . 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 ABDOMINAL HERNIA REPAIR      Family History  Problem Relation Age of Onset  . Emphysema Mother   . Hypertension Father     Allergies  Allergen Reactions  . Penicillins Rash    Current Outpatient Prescriptions on File Prior to  Visit  Medication Sig Dispense Refill  . cetirizine (ZYRTEC) 10 MG tablet Take 10 mg by mouth daily.    . fluticasone (FLONASE) 50 MCG/ACT nasal spray Place 1 spray into both nostrils daily as needed.     . metoprolol succinate (TOPROL-XL) 50 MG 24 hr tablet Take 1.5 tablets by mouth once daily 45 tablet 3   No current facility-administered medications on file prior to visit.     BP (!) 146/71 (BP Location: Left Arm, Patient Position: Sitting, Cuff Size: Large)   Pulse (!) 47   Temp 97.8 F (36.6 C) (Oral)   Ht 5\' 11"  (1.803 m)   Wt 177 lb 3.2 oz (80.4 kg)   SpO2 100%   BMI 24.71 kg/m       Objective:   Physical Exam  Physical Exam  Constitutional: He is oriented to person, place, and time. He appears well-developed and well-nourished. No distress.  HENT:  Head: Normocephalic and atraumatic.  Right Ear: Tympanic membrane and ear canal normal.  Left Ear: Tympanic membrane and ear canal normal.  Mouth/Throat: Oropharynx is clear and moist.  Eyes: Pupils are equal, round, and reactive to light. No scleral icterus.  Neck: Normal range of motion. No thyromegaly present.  Cardiovascular: Normal rate and regular rhythm.   No murmur heard. Pulmonary/Chest: Effort normal and breath sounds normal. No respiratory distress. He has no wheezes.  He has no rales. He exhibits no tenderness.  Abdominal: Soft. Bowel sounds are normal. He exhibits no distension and no mass. There is no tenderness. There is no rebound and no guarding.  Musculoskeletal: He exhibits no edema.  Lymphadenopathy:    He has no cervical adenopathy.  Neurological: He is alert and oriented to person, place, and time. He has normal patellar reflexes. He exhibits normal muscle tone. Coordination normal.  Skin: Skin is warm and dry.  Psychiatric: He has a normal mood and affect. His behavior is normal. Judgment and thought content normal.           Assessment & Plan:   Preventative Care- Continue healthy diet,  exercise.  Refer for colo. Obtain routine lab work.    HTN- uncontrolled. HR is low.  Decrease toprol xl from 75mg  to 50mg  once daily. Add losartan. Will need to monitor sodium with addition of losartan due to hx of SIADH.     Assessment & Plan:

## 2016-05-09 ENCOUNTER — Telehealth: Payer: Self-pay | Admitting: Internal Medicine

## 2016-05-09 DIAGNOSIS — J3089 Other allergic rhinitis: Secondary | ICD-10-CM | POA: Diagnosis not present

## 2016-05-09 DIAGNOSIS — J301 Allergic rhinitis due to pollen: Secondary | ICD-10-CM | POA: Diagnosis not present

## 2016-05-09 DIAGNOSIS — J3081 Allergic rhinitis due to animal (cat) (dog) hair and dander: Secondary | ICD-10-CM | POA: Diagnosis not present

## 2016-05-09 NOTE — Telephone Encounter (Signed)
Received Previous Colon report and placed on Dr. Blanch Media desk for review.  Patient is requesting Dr.Perry bc he has heard good things about him.

## 2016-05-15 ENCOUNTER — Encounter: Payer: Self-pay | Admitting: Internal Medicine

## 2016-05-16 DIAGNOSIS — J3081 Allergic rhinitis due to animal (cat) (dog) hair and dander: Secondary | ICD-10-CM | POA: Diagnosis not present

## 2016-05-16 DIAGNOSIS — J301 Allergic rhinitis due to pollen: Secondary | ICD-10-CM | POA: Diagnosis not present

## 2016-05-16 DIAGNOSIS — J3089 Other allergic rhinitis: Secondary | ICD-10-CM | POA: Diagnosis not present

## 2016-05-19 ENCOUNTER — Other Ambulatory Visit: Payer: Self-pay

## 2016-05-19 ENCOUNTER — Ambulatory Visit (INDEPENDENT_AMBULATORY_CARE_PROVIDER_SITE_OTHER): Payer: PPO | Admitting: Family

## 2016-05-19 DIAGNOSIS — I10 Essential (primary) hypertension: Secondary | ICD-10-CM | POA: Diagnosis not present

## 2016-05-19 LAB — BASIC METABOLIC PANEL
BUN: 17 mg/dL (ref 6–23)
CALCIUM: 8.9 mg/dL (ref 8.4–10.5)
CO2: 30 mEq/L (ref 19–32)
CREATININE: 0.95 mg/dL (ref 0.40–1.50)
Chloride: 99 mEq/L (ref 96–112)
GFR: 83.56 mL/min (ref >60.00)
GLUCOSE: 112 mg/dL — AB (ref 70–99)
POTASSIUM: 4.2 meq/L (ref 3.5–5.1)
Sodium: 133 mEq/L — ABNORMAL LOW (ref 135–145)

## 2016-05-19 MED ORDER — METOPROLOL SUCCINATE ER 25 MG PO TB24: 25.0000 mg | ORAL_TABLET | Freq: Every day | ORAL | 3 refills | Status: DC

## 2016-05-19 MED ORDER — LOSARTAN POTASSIUM 50 MG PO TABS: 50.0000 mg | ORAL_TABLET | Freq: Every day | ORAL | 3 refills | Status: DC

## 2016-05-19 NOTE — Patient Instructions (Addendum)
Per Debbrah Alar patient to have BMET drawn today. Decrease Metoprolol to 25 mg once daily and increase Losartan to 50 mg once daily.  Return to office  for BP check in 2 weeks.

## 2016-05-19 NOTE — Progress Notes (Signed)
Noted and agree. 

## 2016-05-19 NOTE — Progress Notes (Signed)
Pre visit review using our clinic tool,if applicable. No additional management support is needed unless otherwise documented below in the visit note.   Patient in for BP check per order from M. Edwena Blow dated 05/05/16.   BP today = 128/72 P= 47  Per Debbrah Alar patient to have BMET drawn today. Decrease Metoprolol to 25 mg once daily and increase Losartan to 50 mg once daily.  Return to office for BP check in 2 weeks.

## 2016-05-23 DIAGNOSIS — J3089 Other allergic rhinitis: Secondary | ICD-10-CM | POA: Diagnosis not present

## 2016-05-23 DIAGNOSIS — J3081 Allergic rhinitis due to animal (cat) (dog) hair and dander: Secondary | ICD-10-CM | POA: Diagnosis not present

## 2016-05-23 DIAGNOSIS — J301 Allergic rhinitis due to pollen: Secondary | ICD-10-CM | POA: Diagnosis not present

## 2016-05-30 DIAGNOSIS — J3089 Other allergic rhinitis: Secondary | ICD-10-CM | POA: Diagnosis not present

## 2016-05-30 DIAGNOSIS — J3081 Allergic rhinitis due to animal (cat) (dog) hair and dander: Secondary | ICD-10-CM | POA: Diagnosis not present

## 2016-05-30 DIAGNOSIS — J301 Allergic rhinitis due to pollen: Secondary | ICD-10-CM | POA: Diagnosis not present

## 2016-06-01 DIAGNOSIS — J3089 Other allergic rhinitis: Secondary | ICD-10-CM | POA: Diagnosis not present

## 2016-06-01 DIAGNOSIS — J301 Allergic rhinitis due to pollen: Secondary | ICD-10-CM | POA: Diagnosis not present

## 2016-06-02 ENCOUNTER — Ambulatory Visit (INDEPENDENT_AMBULATORY_CARE_PROVIDER_SITE_OTHER): Payer: PPO | Admitting: Family

## 2016-06-02 ENCOUNTER — Other Ambulatory Visit: Payer: PPO

## 2016-06-02 VITALS — BP 146/80 | HR 54

## 2016-06-02 DIAGNOSIS — I10 Essential (primary) hypertension: Secondary | ICD-10-CM | POA: Diagnosis not present

## 2016-06-02 LAB — BASIC METABOLIC PANEL
BUN: 15 mg/dL (ref 6–23)
CHLORIDE: 96 meq/L (ref 96–112)
CO2: 28 mEq/L (ref 19–32)
Calcium: 9.7 mg/dL (ref 8.4–10.5)
Creatinine, Ser: 1.01 mg/dL (ref 0.40–1.50)
GFR: 77.85 mL/min (ref >60.00)
GLUCOSE: 112 mg/dL — AB (ref 70–99)
Potassium: 3.9 mEq/L (ref 3.5–5.1)
SODIUM: 131 meq/L — AB (ref 135–145)

## 2016-06-02 NOTE — Patient Instructions (Addendum)
Per Inda Castle, NP: Continue current medications & regimen. Complete BMP (Basic Metabolic Panel) lab work today. Return in 3 months for an office visit with PCP.

## 2016-06-02 NOTE — Progress Notes (Signed)
Pre visit review using our clinic review tool, if applicable. No additional management support is needed unless otherwise documented below in the visit note.  Patient in office today for a blood pressure recheck. He reported compliance with all medications. Readings during visit were as follow: BP 147/77 P 56 & BP 146/80 P 54.  Patient is asymptomatic.  Per Inda Castle, NP: Continue current medications & regimen. Complete BMP (Basic Metabolic Panel) lab work today. Return in 3 months for an office visit with PCP.  Informed patient of the provider's recommendations. He verbalized understanding and did not have any questions or concerns before leaving the visit.  Next appointment scheduled for 09/04/16 at 2:00 PM.

## 2016-06-05 ENCOUNTER — Telehealth: Payer: Self-pay

## 2016-06-05 ENCOUNTER — Encounter: Payer: PPO | Admitting: Family

## 2016-06-05 NOTE — Telephone Encounter (Signed)
-----   Message from Debbrah Alar, NP sent at 06/05/2016  7:10 AM EDT ----- Please let pt know that his sodium is a touch lower than it has been. I would like for him to repeat his bmet in 1 month, dx hyponatremia.

## 2016-06-05 NOTE — Telephone Encounter (Signed)
Notified pt of results and PCP would like to repeat labs in one month pt stated he will call back to schedule lab appointment

## 2016-06-06 ENCOUNTER — Encounter: Payer: Self-pay | Admitting: Family

## 2016-06-06 DIAGNOSIS — J3081 Allergic rhinitis due to animal (cat) (dog) hair and dander: Secondary | ICD-10-CM | POA: Diagnosis not present

## 2016-06-06 DIAGNOSIS — J3089 Other allergic rhinitis: Secondary | ICD-10-CM | POA: Diagnosis not present

## 2016-06-06 DIAGNOSIS — J301 Allergic rhinitis due to pollen: Secondary | ICD-10-CM | POA: Diagnosis not present

## 2016-06-13 DIAGNOSIS — J3089 Other allergic rhinitis: Secondary | ICD-10-CM | POA: Diagnosis not present

## 2016-06-13 DIAGNOSIS — J301 Allergic rhinitis due to pollen: Secondary | ICD-10-CM | POA: Diagnosis not present

## 2016-06-14 ENCOUNTER — Ambulatory Visit (AMBULATORY_SURGERY_CENTER): Payer: Self-pay

## 2016-06-14 VITALS — Ht 71.0 in | Wt 172.4 lb

## 2016-06-14 DIAGNOSIS — Z860100 Personal history of colon polyps, unspecified: Secondary | ICD-10-CM

## 2016-06-14 MED ORDER — SUPREP BOWEL PREP KIT 17.5-3.13-1.6 GM/177ML PO SOLN: 1.0000 | Freq: Once | ORAL | 0 refills | Status: AC

## 2016-06-14 NOTE — Progress Notes (Signed)
No allergies to eggs or soy No diet meds No home oxygen No past problem with anesthesia  Declined emmi

## 2016-06-19 DIAGNOSIS — J301 Allergic rhinitis due to pollen: Secondary | ICD-10-CM | POA: Diagnosis not present

## 2016-06-19 DIAGNOSIS — J3089 Other allergic rhinitis: Secondary | ICD-10-CM | POA: Diagnosis not present

## 2016-06-22 ENCOUNTER — Encounter: Payer: Self-pay | Admitting: Internal Medicine

## 2016-06-27 DIAGNOSIS — J3089 Other allergic rhinitis: Secondary | ICD-10-CM | POA: Diagnosis not present

## 2016-06-27 DIAGNOSIS — J301 Allergic rhinitis due to pollen: Secondary | ICD-10-CM | POA: Diagnosis not present

## 2016-06-27 NOTE — Telephone Encounter (Signed)
Pt is scheduled for 07-06-16

## 2016-07-03 DIAGNOSIS — J301 Allergic rhinitis due to pollen: Secondary | ICD-10-CM | POA: Diagnosis not present

## 2016-07-03 DIAGNOSIS — J3089 Other allergic rhinitis: Secondary | ICD-10-CM | POA: Diagnosis not present

## 2016-07-03 DIAGNOSIS — J3081 Allergic rhinitis due to animal (cat) (dog) hair and dander: Secondary | ICD-10-CM | POA: Diagnosis not present

## 2016-07-05 DIAGNOSIS — J3089 Other allergic rhinitis: Secondary | ICD-10-CM | POA: Diagnosis not present

## 2016-07-05 DIAGNOSIS — J3081 Allergic rhinitis due to animal (cat) (dog) hair and dander: Secondary | ICD-10-CM | POA: Diagnosis not present

## 2016-07-06 ENCOUNTER — Encounter: Payer: Self-pay | Admitting: Internal Medicine

## 2016-07-06 ENCOUNTER — Ambulatory Visit (AMBULATORY_SURGERY_CENTER): Payer: PPO | Admitting: Internal Medicine

## 2016-07-06 VITALS — BP 130/80 | HR 48 | Temp 97.3°F | Resp 16 | Ht 71.0 in | Wt 172.0 lb

## 2016-07-06 DIAGNOSIS — D128 Benign neoplasm of rectum: Secondary | ICD-10-CM | POA: Diagnosis not present

## 2016-07-06 DIAGNOSIS — D124 Benign neoplasm of descending colon: Secondary | ICD-10-CM | POA: Diagnosis not present

## 2016-07-06 DIAGNOSIS — I10 Essential (primary) hypertension: Secondary | ICD-10-CM | POA: Diagnosis not present

## 2016-07-06 DIAGNOSIS — Z86010 Personal history of colonic polyps: Secondary | ICD-10-CM | POA: Diagnosis not present

## 2016-07-06 DIAGNOSIS — D122 Benign neoplasm of ascending colon: Secondary | ICD-10-CM

## 2016-07-06 DIAGNOSIS — D123 Benign neoplasm of transverse colon: Secondary | ICD-10-CM

## 2016-07-06 MED ORDER — SODIUM CHLORIDE 0.9 % IV SOLN: 500.0000 mL | INTRAVENOUS | Status: DC

## 2016-07-06 NOTE — Progress Notes (Signed)
Report to PACU, RN, vss, BBS= Clear.  

## 2016-07-06 NOTE — Op Note (Signed)
[facility] Patient Name: Gregory Brennan Procedure Date: 07/06/2016 1:51 PM MRN: [id number] Endoscopist: Docia Chuck. Gregory Brennan , MD Age: 71 Referring MD:  Date of Birth: [birth year] Gender: Male Account #: [id number] Procedure:                Colonoscopy, with cold snare polypectomy X6 Indications:              Surveillance: Personal history of adenomatous                            polyps on last colonoscopy > 5 years ago, High risk                            colon cancer surveillance: Personal history of                            non-advanced adenoma. Index examination performed                            in [place] October 2010 with 2 subcentimeter                            adenomas Medicines:                Monitored Anesthesia Care Procedure:                Pre-Anesthesia Assessment:                           - Prior to the procedure, a History and Physical                            was performed, and patient medications and                            allergies were reviewed. The patient's tolerance of                            previous anesthesia was also reviewed. The risks                            and benefits of the procedure and the sedation                            options and risks were discussed with the patient.                            All questions were answered, and informed consent                            was obtained. Prior Anticoagulants: The patient has                            taken no previous anticoagulant or antiplatelet  agents. ASA Grade Assessment: II - A patient with                            mild systemic disease. After reviewing the risks                            and benefits, the patient was deemed in                            satisfactory condition to undergo the procedure.                           After obtaining informed consent, the colonoscope                            was passed under direct vision.  Throughout the                            procedure, the patient's blood pressure, pulse, and                            oxygen saturations were monitored continuously. The                            Colonoscope was introduced through the anus and                            advanced to the the cecum, identified by                            appendiceal orifice and ileocecal valve. The                            ileocecal valve, appendiceal orifice, and rectum                            were photographed. The quality of the bowel                            preparation was excellent. The colonoscopy was                            performed without difficulty. The patient tolerated                            the procedure well. The bowel preparation used was                            SUPREP. Scope In: 2:04:15 PM Scope Out: 2:26:41 PM Scope Withdrawal Time: 0 hours 18 minutes 52 seconds  Total Procedure Duration: 0 hours 22 minutes 26 seconds  Findings:                 Five polyps were found in the rectum, descending  colon, transverse colon and ascending colon. The                            polyps were 2 to 6 mm in size. These polyps were                            removed with a cold snare. Resection and retrieval                            were complete.                           Multiple diverticula were found in the sigmoid                            colon.                           Internal hemorrhoids were found during retroflexion.                           The exam was otherwise without abnormality on                            direct and retroflexion views. Complications:            No immediate complications. Estimated blood loss:                            None. Estimated Blood Loss:     Estimated blood loss: none. Impression:               - Five 2 to 6 mm polyps in the rectum, in the                            descending colon, in the transverse  colon and in                            the ascending colon, removed with a cold snare.                            Resected and retrieved.                           - Diverticulosis in the sigmoid colon.                           - Internal hemorrhoids.                           - The examination was otherwise normal on direct                            and retroflexion views. Recommendation:           - Repeat colonoscopy in 3 years for surveillance.                           -  Patient has a contact number available for                            emergencies. The signs and symptoms of potential                            delayed complications were discussed with the                            patient. Return to normal activities tomorrow.                            Written discharge instructions were provided to the                            patient.                           - Resume previous diet.                           - Continue present medications.                           - Await pathology results. Docia Chuck. Gregory Pastor, MD 07/06/2016 2:31:04 PM This report has been signed electronically.

## 2016-07-06 NOTE — Patient Instructions (Signed)
YOU HAD AN ENDOSCOPIC PROCEDURE TODAY AT [facility]:   Refer to the procedure report that was given to you for any specific questions about what was found during the examination.  If the procedure report does not answer your questions, please call your gastroenterologist to clarify.  If you requested that your care partner not be given the details of your procedure findings, then the procedure report has been included in a sealed envelope for you to review at your convenience later.  YOU SHOULD EXPECT: Some feelings of bloating in the abdomen. Passage of more gas than usual.  Walking can help get rid of the air that was put into your GI tract during the procedure and reduce the bloating. If you had a lower endoscopy (such as a colonoscopy or flexible sigmoidoscopy) you may notice spotting of blood in your stool or on the toilet paper. If you underwent a bowel prep for your procedure, you may not have a normal bowel movement for a few days.  Please Note:  You might notice some irritation and congestion in your nose or some drainage.  This is from the oxygen used during your procedure.  There is no need for concern and it should clear up in a day or so.  SYMPTOMS TO REPORT IMMEDIATELY:   Following lower endoscopy (colonoscopy or flexible sigmoidoscopy):  Excessive amounts of blood in the stool  Significant tenderness or worsening of abdominal pains  Swelling of the abdomen that is new, acute  Fever of 100F or higher  For urgent or emergent issues, a gastroenterologist can be reached at any hour by calling [phone].  DIET:  We do recommend a small meal at first, but then you may proceed to your regular diet.  Drink plenty of fluids but you should avoid alcoholic beverages for 24 hours.  ACTIVITY:  You should plan to take it easy for the rest of today and you should NOT DRIVE or use heavy machinery until tomorrow (because of the sedation medicines used during the test).     FOLLOW UP: Our staff will call the number listed on your records the next business day following your procedure to check on you and address any questions or concerns that you may have regarding the information given to you following your procedure. If we do not reach you, we will leave a message.  However, if you are feeling well and you are not experiencing any problems, there is no need to return our call.  We will assume that you have returned to your regular daily activities without incident.  If any biopsies were taken you will be contacted by phone or by letter within the next 1-3 weeks.  Please call us at [phone] if you have not heard about the biopsies in 3 weeks.    SIGNATURES/CONFIDENTIALITY: You and/or your care partner have signed paperwork which will be entered into your electronic medical record.  These signatures attest to the fact that that the information above on your After Visit Summary has been reviewed and is understood.  Full responsibility of the confidentiality of this discharge information lies with you and/or your care-partner.  Await pathology  Please continue your normal medications   Please read over handouts about polyps, diverticulosis, hemorrhoids and high fiber diets

## 2016-07-06 NOTE — Progress Notes (Signed)
Called to room to assist during endoscopic procedure.  Patient ID and intended procedure confirmed with present staff. Received instructions for my participation in the procedure from the performing physician.  

## 2016-07-07 ENCOUNTER — Telehealth: Payer: Self-pay | Admitting: *Deleted

## 2016-07-07 NOTE — Telephone Encounter (Signed)
  Follow up Call-  Call back number 07/06/2016  Post procedure Call Back phone  # [phone]  Permission to leave phone message Yes  Some recent data might be hidden     Patient questions:  Do you have a fever, pain , or abdominal swelling? No. Pain Score  0 *  Have you tolerated food without any problems? Yes.    Have you been able to return to your normal activities? Yes.    Do you have any questions about your discharge instructions: Diet   No. Medications  No. Follow up visit  No.  Do you have questions or concerns about your Care? No.  Actions: * If pain score is 4 or above: No action needed, pain <4.

## 2016-07-11 ENCOUNTER — Encounter: Payer: Self-pay | Admitting: Internal Medicine

## 2016-07-11 DIAGNOSIS — J3089 Other allergic rhinitis: Secondary | ICD-10-CM | POA: Diagnosis not present

## 2016-07-11 DIAGNOSIS — J301 Allergic rhinitis due to pollen: Secondary | ICD-10-CM | POA: Diagnosis not present

## 2016-07-13 DIAGNOSIS — J3089 Other allergic rhinitis: Secondary | ICD-10-CM | POA: Diagnosis not present

## 2016-07-13 DIAGNOSIS — J301 Allergic rhinitis due to pollen: Secondary | ICD-10-CM | POA: Diagnosis not present

## 2016-07-18 DIAGNOSIS — J301 Allergic rhinitis due to pollen: Secondary | ICD-10-CM | POA: Diagnosis not present

## 2016-07-18 DIAGNOSIS — J3089 Other allergic rhinitis: Secondary | ICD-10-CM | POA: Diagnosis not present

## 2016-07-21 DIAGNOSIS — J301 Allergic rhinitis due to pollen: Secondary | ICD-10-CM | POA: Diagnosis not present

## 2016-07-21 DIAGNOSIS — J3089 Other allergic rhinitis: Secondary | ICD-10-CM | POA: Diagnosis not present

## 2016-07-25 DIAGNOSIS — J3089 Other allergic rhinitis: Secondary | ICD-10-CM | POA: Diagnosis not present

## 2016-07-25 DIAGNOSIS — J301 Allergic rhinitis due to pollen: Secondary | ICD-10-CM | POA: Diagnosis not present

## 2016-07-25 DIAGNOSIS — J3081 Allergic rhinitis due to animal (cat) (dog) hair and dander: Secondary | ICD-10-CM | POA: Diagnosis not present

## 2016-07-26 ENCOUNTER — Other Ambulatory Visit: Payer: Self-pay | Admitting: Family

## 2016-08-04 DIAGNOSIS — J301 Allergic rhinitis due to pollen: Secondary | ICD-10-CM | POA: Diagnosis not present

## 2016-08-04 DIAGNOSIS — J3089 Other allergic rhinitis: Secondary | ICD-10-CM | POA: Diagnosis not present

## 2016-08-04 DIAGNOSIS — J3081 Allergic rhinitis due to animal (cat) (dog) hair and dander: Secondary | ICD-10-CM | POA: Diagnosis not present

## 2016-08-08 DIAGNOSIS — J301 Allergic rhinitis due to pollen: Secondary | ICD-10-CM | POA: Diagnosis not present

## 2016-08-08 DIAGNOSIS — J3089 Other allergic rhinitis: Secondary | ICD-10-CM | POA: Diagnosis not present

## 2016-08-08 DIAGNOSIS — J3081 Allergic rhinitis due to animal (cat) (dog) hair and dander: Secondary | ICD-10-CM | POA: Diagnosis not present

## 2016-08-15 DIAGNOSIS — J3089 Other allergic rhinitis: Secondary | ICD-10-CM | POA: Diagnosis not present

## 2016-08-15 DIAGNOSIS — J301 Allergic rhinitis due to pollen: Secondary | ICD-10-CM | POA: Diagnosis not present

## 2016-08-23 DIAGNOSIS — J3089 Other allergic rhinitis: Secondary | ICD-10-CM | POA: Diagnosis not present

## 2016-08-23 DIAGNOSIS — J301 Allergic rhinitis due to pollen: Secondary | ICD-10-CM | POA: Diagnosis not present

## 2016-08-29 DIAGNOSIS — J3081 Allergic rhinitis due to animal (cat) (dog) hair and dander: Secondary | ICD-10-CM | POA: Diagnosis not present

## 2016-08-29 DIAGNOSIS — J301 Allergic rhinitis due to pollen: Secondary | ICD-10-CM | POA: Diagnosis not present

## 2016-08-29 DIAGNOSIS — J3089 Other allergic rhinitis: Secondary | ICD-10-CM | POA: Diagnosis not present

## 2016-09-04 ENCOUNTER — Encounter: Payer: Self-pay | Admitting: Family

## 2016-09-04 ENCOUNTER — Ambulatory Visit (INDEPENDENT_AMBULATORY_CARE_PROVIDER_SITE_OTHER): Payer: PPO | Admitting: Family

## 2016-09-04 VITALS — BP 147/76 | HR 50 | Temp 98.4°F | Resp 16 | Ht 71.0 in | Wt 166.6 lb

## 2016-09-04 DIAGNOSIS — E871 Hypo-osmolality and hyponatremia: Secondary | ICD-10-CM | POA: Diagnosis not present

## 2016-09-04 DIAGNOSIS — I10 Essential (primary) hypertension: Secondary | ICD-10-CM

## 2016-09-04 NOTE — Progress Notes (Signed)
Subjective:    Patient ID: Gregory Brennan, male    DOB: 07/26/1945, 71 y.o.   MRN: [id number]  HPI  Gregory Brennan is a 71 yr old male who presents today for follow up of his hypertension. He is currently maintained on losartan 50mg  and toprol xl 25 mg. Reports that home BP readings generally 130'80's.    BP Readings from Last 3 Encounters:  09/04/16 (!) 147/76  07/06/16 130/80  06/02/16 (!) 146/80   Hyponatremia- chronic. Reports that he has added salt back to his diet.    Review of Systems    see HPI  Past Medical History:  Diagnosis Date  . Allergy   . Heart murmur   . Hypertension   . MRSA (methicillin resistant staph aureus) culture positive 2007  . Rheumatic fever    age 74     Social History   Social History  . Marital status: Married    Spouse name: N/A  . Number of children: N/A  . Years of education: N/A   Occupational History  . Not on file.   Social History Main Topics  . Smoking status: Never Smoker  . Smokeless tobacco: Never Used  . Alcohol use No  . Drug use: No  . Sexual activity: Yes   Other Topics Concern  . 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 ABDOMINAL HERNIA REPAIR  12/31/2011    Family History  Problem Relation Age of Onset  . Emphysema Mother   . Hypertension Father   . Colon cancer Neg Hx   . Colon polyps Neg Hx   . Esophageal cancer Neg Hx   . Rectal cancer Neg Hx   . Stomach cancer Neg Hx     Allergies  Allergen Reactions  . Penicillins Rash    Current Outpatient Prescriptions on File Prior to Visit  Medication Sig Dispense Refill  . cetirizine (ZYRTEC) 10 MG tablet Take 10 mg by mouth daily.    . fluticasone (FLONASE) 50  MCG/ACT nasal spray Place 1 spray into both nostrils daily as needed.     Marland Kitchen losartan (COZAAR) 50 MG tablet Take 1 tablet (50 mg total) by mouth daily. 90 tablet 3  . metoprolol succinate (TOPROL-XL) 25 MG 24 hr tablet Take 1 tablet (25 mg total) by mouth daily. 90 tablet 3   Current Facility-Administered Medications on File Prior to Visit  Medication Dose Route Frequency Provider Last Rate Last Dose  . 0.9 %  sodium chloride infusion  500 mL Intravenous Continuous Irene Shipper, MD        BP (!) 147/76 (BP Location: Right Arm, Cuff Size: Normal)   Pulse (!) 50   Temp 98.4 F (36.9 C) (Oral)   Resp 16   Ht 5\' 11"  (1.803 m)   Wt 166 lb 9.6 oz (75.6 kg)   SpO2 100%   BMI 23.24 kg/m    Objective:   Physical Exam  Constitutional: He is oriented to person, place, and time. He appears well-developed and well-nourished. No distress.  HENT:  Head: Normocephalic and atraumatic.  Cardiovascular: Normal rate and regular rhythm.  No murmur heard. Pulmonary/Chest: Effort normal and breath sounds normal. No respiratory distress. He has no wheezes. He has no rales.  Musculoskeletal: He exhibits no edema.  Neurological: He is alert and oriented to person, place, and time.  Skin: Skin is warm and dry.  Psychiatric: He has a normal mood and affect. His behavior is normal. Thought content normal.          Assessment & Plan:  HTN- BP acceptable for his age. Continue current medications.   Hyponatremia- obtain follow up bmet.

## 2016-09-04 NOTE — Patient Instructions (Signed)
Please complete lab work prior to leaving.   

## 2016-09-05 LAB — BASIC METABOLIC PANEL
BUN: 12 mg/dL (ref 6–23)
CHLORIDE: 97 meq/L (ref 96–112)
CO2: 29 meq/L (ref 19–32)
CREATININE: 1.02 mg/dL (ref 0.40–1.50)
Calcium: 9.5 mg/dL (ref 8.4–10.5)
GFR: 76.91 mL/min (ref >60.00)
Glucose, Bld: 94 mg/dL (ref 70–99)
Potassium: 4.2 mEq/L (ref 3.5–5.1)
Sodium: 131 mEq/L — ABNORMAL LOW (ref 135–145)

## 2016-09-06 DIAGNOSIS — J3089 Other allergic rhinitis: Secondary | ICD-10-CM | POA: Diagnosis not present

## 2016-09-06 DIAGNOSIS — J3081 Allergic rhinitis due to animal (cat) (dog) hair and dander: Secondary | ICD-10-CM | POA: Diagnosis not present

## 2016-09-06 DIAGNOSIS — J301 Allergic rhinitis due to pollen: Secondary | ICD-10-CM | POA: Diagnosis not present

## 2016-09-12 DIAGNOSIS — J301 Allergic rhinitis due to pollen: Secondary | ICD-10-CM | POA: Diagnosis not present

## 2016-09-12 DIAGNOSIS — J3089 Other allergic rhinitis: Secondary | ICD-10-CM | POA: Diagnosis not present

## 2016-09-19 DIAGNOSIS — J3089 Other allergic rhinitis: Secondary | ICD-10-CM | POA: Diagnosis not present

## 2016-09-19 DIAGNOSIS — J301 Allergic rhinitis due to pollen: Secondary | ICD-10-CM | POA: Diagnosis not present

## 2016-09-27 DIAGNOSIS — J3089 Other allergic rhinitis: Secondary | ICD-10-CM | POA: Diagnosis not present

## 2016-09-27 DIAGNOSIS — J301 Allergic rhinitis due to pollen: Secondary | ICD-10-CM | POA: Diagnosis not present

## 2016-10-04 DIAGNOSIS — J3081 Allergic rhinitis due to animal (cat) (dog) hair and dander: Secondary | ICD-10-CM | POA: Diagnosis not present

## 2016-10-04 DIAGNOSIS — J3089 Other allergic rhinitis: Secondary | ICD-10-CM | POA: Diagnosis not present

## 2016-10-04 DIAGNOSIS — J301 Allergic rhinitis due to pollen: Secondary | ICD-10-CM | POA: Diagnosis not present

## 2016-10-10 DIAGNOSIS — J3089 Other allergic rhinitis: Secondary | ICD-10-CM | POA: Diagnosis not present

## 2016-10-10 DIAGNOSIS — J301 Allergic rhinitis due to pollen: Secondary | ICD-10-CM | POA: Diagnosis not present

## 2016-10-16 DIAGNOSIS — J301 Allergic rhinitis due to pollen: Secondary | ICD-10-CM | POA: Diagnosis not present

## 2016-10-16 DIAGNOSIS — J3089 Other allergic rhinitis: Secondary | ICD-10-CM | POA: Diagnosis not present

## 2016-10-24 DIAGNOSIS — J3089 Other allergic rhinitis: Secondary | ICD-10-CM | POA: Diagnosis not present

## 2016-10-24 DIAGNOSIS — J301 Allergic rhinitis due to pollen: Secondary | ICD-10-CM | POA: Diagnosis not present

## 2016-10-24 DIAGNOSIS — J3081 Allergic rhinitis due to animal (cat) (dog) hair and dander: Secondary | ICD-10-CM | POA: Diagnosis not present

## 2016-10-31 DIAGNOSIS — J3081 Allergic rhinitis due to animal (cat) (dog) hair and dander: Secondary | ICD-10-CM | POA: Diagnosis not present

## 2016-10-31 DIAGNOSIS — J301 Allergic rhinitis due to pollen: Secondary | ICD-10-CM | POA: Diagnosis not present

## 2016-10-31 DIAGNOSIS — J3089 Other allergic rhinitis: Secondary | ICD-10-CM | POA: Diagnosis not present

## 2016-11-02 DIAGNOSIS — J3081 Allergic rhinitis due to animal (cat) (dog) hair and dander: Secondary | ICD-10-CM | POA: Diagnosis not present

## 2016-11-02 DIAGNOSIS — J301 Allergic rhinitis due to pollen: Secondary | ICD-10-CM | POA: Diagnosis not present

## 2016-11-02 DIAGNOSIS — J3089 Other allergic rhinitis: Secondary | ICD-10-CM | POA: Diagnosis not present

## 2016-11-08 DIAGNOSIS — J301 Allergic rhinitis due to pollen: Secondary | ICD-10-CM | POA: Diagnosis not present

## 2016-11-08 DIAGNOSIS — J3089 Other allergic rhinitis: Secondary | ICD-10-CM | POA: Diagnosis not present

## 2016-11-08 DIAGNOSIS — J3081 Allergic rhinitis due to animal (cat) (dog) hair and dander: Secondary | ICD-10-CM | POA: Diagnosis not present

## 2016-11-14 DIAGNOSIS — J301 Allergic rhinitis due to pollen: Secondary | ICD-10-CM | POA: Diagnosis not present

## 2016-11-14 DIAGNOSIS — J3089 Other allergic rhinitis: Secondary | ICD-10-CM | POA: Diagnosis not present

## 2016-11-21 DIAGNOSIS — J301 Allergic rhinitis due to pollen: Secondary | ICD-10-CM | POA: Diagnosis not present

## 2016-11-21 DIAGNOSIS — J3089 Other allergic rhinitis: Secondary | ICD-10-CM | POA: Diagnosis not present

## 2016-11-21 DIAGNOSIS — J3081 Allergic rhinitis due to animal (cat) (dog) hair and dander: Secondary | ICD-10-CM | POA: Diagnosis not present

## 2016-12-04 DIAGNOSIS — J3081 Allergic rhinitis due to animal (cat) (dog) hair and dander: Secondary | ICD-10-CM | POA: Diagnosis not present

## 2016-12-04 DIAGNOSIS — J301 Allergic rhinitis due to pollen: Secondary | ICD-10-CM | POA: Diagnosis not present

## 2016-12-04 DIAGNOSIS — J3089 Other allergic rhinitis: Secondary | ICD-10-CM | POA: Diagnosis not present

## 2016-12-11 DIAGNOSIS — J3089 Other allergic rhinitis: Secondary | ICD-10-CM | POA: Diagnosis not present

## 2016-12-11 DIAGNOSIS — J301 Allergic rhinitis due to pollen: Secondary | ICD-10-CM | POA: Diagnosis not present

## 2016-12-15 DIAGNOSIS — J301 Allergic rhinitis due to pollen: Secondary | ICD-10-CM | POA: Diagnosis not present

## 2016-12-15 DIAGNOSIS — J3089 Other allergic rhinitis: Secondary | ICD-10-CM | POA: Diagnosis not present

## 2016-12-22 DIAGNOSIS — J301 Allergic rhinitis due to pollen: Secondary | ICD-10-CM | POA: Diagnosis not present

## 2016-12-22 DIAGNOSIS — J3089 Other allergic rhinitis: Secondary | ICD-10-CM | POA: Diagnosis not present

## 2016-12-22 DIAGNOSIS — J3081 Allergic rhinitis due to animal (cat) (dog) hair and dander: Secondary | ICD-10-CM | POA: Diagnosis not present

## 2016-12-26 DIAGNOSIS — J301 Allergic rhinitis due to pollen: Secondary | ICD-10-CM | POA: Diagnosis not present

## 2016-12-26 DIAGNOSIS — J3089 Other allergic rhinitis: Secondary | ICD-10-CM | POA: Diagnosis not present

## 2016-12-29 DIAGNOSIS — J301 Allergic rhinitis due to pollen: Secondary | ICD-10-CM | POA: Diagnosis not present

## 2016-12-29 DIAGNOSIS — J3089 Other allergic rhinitis: Secondary | ICD-10-CM | POA: Diagnosis not present

## 2016-12-29 DIAGNOSIS — J3081 Allergic rhinitis due to animal (cat) (dog) hair and dander: Secondary | ICD-10-CM | POA: Diagnosis not present

## 2017-01-02 DIAGNOSIS — J3089 Other allergic rhinitis: Secondary | ICD-10-CM | POA: Diagnosis not present

## 2017-01-02 DIAGNOSIS — J301 Allergic rhinitis due to pollen: Secondary | ICD-10-CM | POA: Diagnosis not present

## 2017-01-08 DIAGNOSIS — J3089 Other allergic rhinitis: Secondary | ICD-10-CM | POA: Diagnosis not present

## 2017-01-08 DIAGNOSIS — J301 Allergic rhinitis due to pollen: Secondary | ICD-10-CM | POA: Diagnosis not present

## 2017-01-12 ENCOUNTER — Ambulatory Visit: Payer: PPO | Admitting: Family

## 2017-01-16 DIAGNOSIS — J301 Allergic rhinitis due to pollen: Secondary | ICD-10-CM | POA: Diagnosis not present

## 2017-01-16 DIAGNOSIS — J3089 Other allergic rhinitis: Secondary | ICD-10-CM | POA: Diagnosis not present

## 2017-01-18 ENCOUNTER — Ambulatory Visit (INDEPENDENT_AMBULATORY_CARE_PROVIDER_SITE_OTHER): Payer: PPO | Admitting: Family

## 2017-01-18 ENCOUNTER — Encounter: Payer: Self-pay | Admitting: Family

## 2017-01-18 VITALS — BP 130/76 | HR 54 | Temp 97.7°F | Resp 18 | Wt 170.8 lb

## 2017-01-18 DIAGNOSIS — M26629 Arthralgia of temporomandibular joint, unspecified side: Secondary | ICD-10-CM | POA: Diagnosis not present

## 2017-01-18 MED ORDER — MELOXICAM 7.5 MG PO TABS: 7.5000 mg | ORAL_TABLET | Freq: Every day | ORAL | 0 refills | Status: DC

## 2017-01-18 NOTE — Progress Notes (Signed)
Subjective:    Patient ID: Gregory Brennan, male    DOB: [birth year], 71 y.o.   MRN: [id number]  HPI   Reports that he got up one morning with left sided jaw tightness. Forced his jaw opened and he heard a pop.  Had sudden pain. Opened again- same thing happened. Reports that the first week after this occurred he stayed on a soft diet due to difficulty opening his jaw.  Reports that he eats small bites and avoids hard foods.  Improving over the last 6-7 days.    He is excited about his upcoming trip to [place].    Review of Systems See HPI  Past Medical History:  Diagnosis Date  . Allergy   . Heart murmur   . Hypertension   . MRSA (methicillin resistant staph aureus) culture positive 2007  . Rheumatic fever    age 55     Social History   Social History  . Marital status: Married    Spouse name: N/A  . Number of children: N/A  . Years of education: N/A   Occupational History  . Not on file.   Social History Main Topics  . Smoking status: Never Smoker  . Smokeless tobacco: Never Used  . Alcohol use No  . Drug use: No  . Sexual activity: Yes   Other Topics Concern  . 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 ABDOMINAL HERNIA REPAIR  12/31/2011    Family History  Problem Relation Age of Onset  . Emphysema Mother   . Hypertension Father   . Colon cancer Neg Hx   . Colon polyps Neg Hx   . Esophageal cancer Neg Hx   . Rectal cancer Neg Hx   . Stomach cancer Neg Hx     Allergies  Allergen Reactions  . Penicillins Rash    Current Outpatient Prescriptions on File Prior to Visit  Medication Sig Dispense Refill  . cetirizine (ZYRTEC) 10 MG tablet Take 10 mg by mouth  daily.    . fluticasone (FLONASE) 50 MCG/ACT nasal spray Place 1 spray into both nostrils daily as needed.     Marland Kitchen losartan (COZAAR) 50 MG tablet Take 1 tablet (50 mg total) by mouth daily. 90 tablet 3  . metoprolol succinate (TOPROL-XL) 25 MG 24 hr tablet Take 1 tablet (25 mg total) by mouth daily. 90 tablet 3   No current facility-administered medications on file prior to visit.     BP 130/76 (BP Location: Left Arm, Patient Position: Sitting, Cuff Size: Normal)   Pulse (!) 54   Temp 97.7 F (36.5 C) (Oral)   Resp 18   Wt 170 lb 12.8 oz (77.5 kg)   SpO2 98%   BMI 23.82 kg/m       Objective:   Physical Exam  Constitutional: He is oriented to person, place, and time. He appears well-developed and well-nourished. No distress.  Neurological: He is alert and oriented to person, place, and time.  Skin: Skin is warm and dry.  Psychiatric: He has a normal mood and affect. His behavior is normal. Judgment and thought content normal.  Musculoskeletal:  Able to open jaw about 2/3, no click of tmj, no significant tenderness to palpation        Assessment & Plan:  TMJ syndrome- advised pt to begin meloxicam. Call if symptoms worsen or if they do not continue to improve.  Pt verbalizes understanding.

## 2017-01-18 NOTE — Patient Instructions (Signed)
Please begin meloxicam once daily for your jaw.  Call if symptoms worsen or if they don't continue to improve.  Temporomandibular Joint Syndrome Temporomandibular joint (TMJ) syndrome is a condition that affects the joints between your jaw and your skull. The TMJs are located near your ears and allow your jaw to open and close. These joints and the nearby muscles are involved in all movements of the jaw. People with TMJ syndrome have pain in the area of these joints and muscles. Chewing, biting, or other movements of the jaw can be difficult or painful. TMJ syndrome can be caused by various things. In many cases, the condition is mild and goes away within a few weeks. For some people, the condition can become a long-term problem. What are the causes? Possible causes of TMJ syndrome include:  Grinding your teeth or clenching your jaw. Some people do this when they are under stress.  Arthritis.  Injury to the jaw.  Head or neck injury.  Teeth or dentures that are not aligned well.  In some cases, the cause of TMJ syndrome may not be known. What are the signs or symptoms? The most common symptom is an aching pain on the side of the head in the area of the TMJ. Other symptoms may include:  Pain when moving your jaw, such as when chewing or biting.  Being unable to open your jaw all the way.  Making a clicking sound when you open your mouth.  Headache.  Earache.  Neck or shoulder pain.  How is this diagnosed? Diagnosis can usually be made based on your symptoms, your medical history, and a physical exam. Your health care provider may check the range of motion of your jaw. Imaging tests, such as X-rays or an MRI, are sometimes done. You may need to see your dentist to determine if your teeth and jaw are lined up correctly. How is this treated? TMJ syndrome often goes away on its own. If treatment is needed, the options may include:  Eating soft foods and applying ice or  heat.  Medicines to relieve pain or inflammation.  Medicines to relax the muscles.  A splint, bite plate, or mouthpiece to prevent teeth grinding or jaw clenching.  Relaxation techniques or counseling to help reduce stress.  Transcutaneous electrical nerve stimulation (TENS). This helps to relieve pain by applying an electrical current through the skin.  Acupuncture. This is sometimes helpful to relieve pain.  Jaw surgery. This is rarely needed.  Follow these instructions at home:  Take medicines only as directed by your health care provider.  Eat a soft diet if you are having trouble chewing.  Apply ice to the painful area. ? Put ice in a plastic bag. ? Place a towel between your skin and the bag. ? Leave the ice on for 20 minutes, 2-3 times a day.  Apply a warm compress to the painful area as directed.  Massage your jaw area and perform any jaw stretching exercises as recommended by your health care provider.  If you were given a mouthpiece or bite plate, wear it as directed.  Avoid foods that require a lot of chewing. Do not chew gum.  Keep all follow-up visits as directed by your health care provider. This is important. Contact a health care provider if:  You are having trouble eating.  You have new or worsening symptoms. Get help right away if:  Your jaw locks open or closed. This information is not intended to replace advice given  to you by your health care provider. Make sure you discuss any questions you have with your health care provider. Document Released: 11/29/2000 Document Revised: 11/04/2015 Document Reviewed: 10/09/2013 Elsevier Interactive Patient Education  [employer].

## 2017-01-23 DIAGNOSIS — J3081 Allergic rhinitis due to animal (cat) (dog) hair and dander: Secondary | ICD-10-CM | POA: Diagnosis not present

## 2017-01-23 DIAGNOSIS — J3089 Other allergic rhinitis: Secondary | ICD-10-CM | POA: Diagnosis not present

## 2017-01-23 DIAGNOSIS — J301 Allergic rhinitis due to pollen: Secondary | ICD-10-CM | POA: Diagnosis not present

## 2017-01-29 DIAGNOSIS — J301 Allergic rhinitis due to pollen: Secondary | ICD-10-CM | POA: Diagnosis not present

## 2017-01-29 DIAGNOSIS — J3089 Other allergic rhinitis: Secondary | ICD-10-CM | POA: Diagnosis not present

## 2017-02-06 DIAGNOSIS — J301 Allergic rhinitis due to pollen: Secondary | ICD-10-CM | POA: Diagnosis not present

## 2017-02-06 DIAGNOSIS — J3089 Other allergic rhinitis: Secondary | ICD-10-CM | POA: Diagnosis not present

## 2017-02-14 DIAGNOSIS — J3089 Other allergic rhinitis: Secondary | ICD-10-CM | POA: Diagnosis not present

## 2017-02-14 DIAGNOSIS — J301 Allergic rhinitis due to pollen: Secondary | ICD-10-CM | POA: Diagnosis not present

## 2017-02-14 DIAGNOSIS — J3081 Allergic rhinitis due to animal (cat) (dog) hair and dander: Secondary | ICD-10-CM | POA: Diagnosis not present

## 2017-02-22 DIAGNOSIS — J3089 Other allergic rhinitis: Secondary | ICD-10-CM | POA: Diagnosis not present

## 2017-02-22 DIAGNOSIS — J301 Allergic rhinitis due to pollen: Secondary | ICD-10-CM | POA: Diagnosis not present

## 2017-02-22 DIAGNOSIS — J3081 Allergic rhinitis due to animal (cat) (dog) hair and dander: Secondary | ICD-10-CM | POA: Diagnosis not present

## 2017-02-28 DIAGNOSIS — J3089 Other allergic rhinitis: Secondary | ICD-10-CM | POA: Diagnosis not present

## 2017-02-28 DIAGNOSIS — J301 Allergic rhinitis due to pollen: Secondary | ICD-10-CM | POA: Diagnosis not present

## 2017-03-05 DIAGNOSIS — J3089 Other allergic rhinitis: Secondary | ICD-10-CM | POA: Diagnosis not present

## 2017-03-05 DIAGNOSIS — J301 Allergic rhinitis due to pollen: Secondary | ICD-10-CM | POA: Diagnosis not present

## 2017-03-16 DIAGNOSIS — J301 Allergic rhinitis due to pollen: Secondary | ICD-10-CM | POA: Diagnosis not present

## 2017-03-16 DIAGNOSIS — J3089 Other allergic rhinitis: Secondary | ICD-10-CM | POA: Diagnosis not present

## 2017-03-21 DIAGNOSIS — J301 Allergic rhinitis due to pollen: Secondary | ICD-10-CM | POA: Diagnosis not present

## 2017-03-21 DIAGNOSIS — J3081 Allergic rhinitis due to animal (cat) (dog) hair and dander: Secondary | ICD-10-CM | POA: Diagnosis not present

## 2017-03-21 DIAGNOSIS — J3089 Other allergic rhinitis: Secondary | ICD-10-CM | POA: Diagnosis not present

## 2017-03-28 DIAGNOSIS — J3089 Other allergic rhinitis: Secondary | ICD-10-CM | POA: Diagnosis not present

## 2017-03-28 DIAGNOSIS — J301 Allergic rhinitis due to pollen: Secondary | ICD-10-CM | POA: Diagnosis not present

## 2017-03-29 DIAGNOSIS — H43811 Vitreous degeneration, right eye: Secondary | ICD-10-CM | POA: Diagnosis not present

## 2017-03-29 DIAGNOSIS — H2513 Age-related nuclear cataract, bilateral: Secondary | ICD-10-CM | POA: Diagnosis not present

## 2017-04-04 DIAGNOSIS — J301 Allergic rhinitis due to pollen: Secondary | ICD-10-CM | POA: Diagnosis not present

## 2017-04-04 DIAGNOSIS — J3089 Other allergic rhinitis: Secondary | ICD-10-CM | POA: Diagnosis not present

## 2017-04-11 DIAGNOSIS — J3089 Other allergic rhinitis: Secondary | ICD-10-CM | POA: Diagnosis not present

## 2017-04-11 DIAGNOSIS — J301 Allergic rhinitis due to pollen: Secondary | ICD-10-CM | POA: Diagnosis not present

## 2017-04-12 DIAGNOSIS — J3089 Other allergic rhinitis: Secondary | ICD-10-CM | POA: Diagnosis not present

## 2017-04-12 DIAGNOSIS — J301 Allergic rhinitis due to pollen: Secondary | ICD-10-CM | POA: Diagnosis not present

## 2017-04-16 DIAGNOSIS — J3089 Other allergic rhinitis: Secondary | ICD-10-CM | POA: Diagnosis not present

## 2017-04-16 DIAGNOSIS — J301 Allergic rhinitis due to pollen: Secondary | ICD-10-CM | POA: Diagnosis not present

## 2017-05-01 DIAGNOSIS — J3081 Allergic rhinitis due to animal (cat) (dog) hair and dander: Secondary | ICD-10-CM | POA: Diagnosis not present

## 2017-05-01 DIAGNOSIS — J3089 Other allergic rhinitis: Secondary | ICD-10-CM | POA: Diagnosis not present

## 2017-05-01 DIAGNOSIS — J301 Allergic rhinitis due to pollen: Secondary | ICD-10-CM | POA: Diagnosis not present

## 2017-05-02 ENCOUNTER — Other Ambulatory Visit: Payer: Self-pay | Admitting: Family

## 2017-05-09 DIAGNOSIS — J3089 Other allergic rhinitis: Secondary | ICD-10-CM | POA: Diagnosis not present

## 2017-05-09 DIAGNOSIS — J3081 Allergic rhinitis due to animal (cat) (dog) hair and dander: Secondary | ICD-10-CM | POA: Diagnosis not present

## 2017-05-09 DIAGNOSIS — J301 Allergic rhinitis due to pollen: Secondary | ICD-10-CM | POA: Diagnosis not present

## 2017-05-11 DIAGNOSIS — J301 Allergic rhinitis due to pollen: Secondary | ICD-10-CM | POA: Diagnosis not present

## 2017-05-11 DIAGNOSIS — J3089 Other allergic rhinitis: Secondary | ICD-10-CM | POA: Diagnosis not present

## 2017-05-17 DIAGNOSIS — J3089 Other allergic rhinitis: Secondary | ICD-10-CM | POA: Diagnosis not present

## 2017-05-17 DIAGNOSIS — J301 Allergic rhinitis due to pollen: Secondary | ICD-10-CM | POA: Diagnosis not present

## 2017-05-23 DIAGNOSIS — J3081 Allergic rhinitis due to animal (cat) (dog) hair and dander: Secondary | ICD-10-CM | POA: Diagnosis not present

## 2017-05-23 DIAGNOSIS — J301 Allergic rhinitis due to pollen: Secondary | ICD-10-CM | POA: Diagnosis not present

## 2017-05-23 DIAGNOSIS — J3089 Other allergic rhinitis: Secondary | ICD-10-CM | POA: Diagnosis not present

## 2017-05-30 DIAGNOSIS — J3081 Allergic rhinitis due to animal (cat) (dog) hair and dander: Secondary | ICD-10-CM | POA: Diagnosis not present

## 2017-05-30 DIAGNOSIS — J301 Allergic rhinitis due to pollen: Secondary | ICD-10-CM | POA: Diagnosis not present

## 2017-05-30 DIAGNOSIS — J3089 Other allergic rhinitis: Secondary | ICD-10-CM | POA: Diagnosis not present

## 2017-05-31 ENCOUNTER — Other Ambulatory Visit: Payer: Self-pay | Admitting: Family

## 2017-06-05 DIAGNOSIS — J3089 Other allergic rhinitis: Secondary | ICD-10-CM | POA: Diagnosis not present

## 2017-06-05 DIAGNOSIS — J301 Allergic rhinitis due to pollen: Secondary | ICD-10-CM | POA: Diagnosis not present

## 2017-06-14 DIAGNOSIS — J3089 Other allergic rhinitis: Secondary | ICD-10-CM | POA: Diagnosis not present

## 2017-06-14 DIAGNOSIS — J301 Allergic rhinitis due to pollen: Secondary | ICD-10-CM | POA: Diagnosis not present

## 2017-06-14 DIAGNOSIS — J3081 Allergic rhinitis due to animal (cat) (dog) hair and dander: Secondary | ICD-10-CM | POA: Diagnosis not present

## 2017-06-21 DIAGNOSIS — J301 Allergic rhinitis due to pollen: Secondary | ICD-10-CM | POA: Diagnosis not present

## 2017-06-21 DIAGNOSIS — J3089 Other allergic rhinitis: Secondary | ICD-10-CM | POA: Diagnosis not present

## 2017-06-27 DIAGNOSIS — J3081 Allergic rhinitis due to animal (cat) (dog) hair and dander: Secondary | ICD-10-CM | POA: Diagnosis not present

## 2017-06-27 DIAGNOSIS — J301 Allergic rhinitis due to pollen: Secondary | ICD-10-CM | POA: Diagnosis not present

## 2017-06-27 DIAGNOSIS — J3089 Other allergic rhinitis: Secondary | ICD-10-CM | POA: Diagnosis not present

## 2017-07-05 DIAGNOSIS — J3089 Other allergic rhinitis: Secondary | ICD-10-CM | POA: Diagnosis not present

## 2017-07-05 DIAGNOSIS — J301 Allergic rhinitis due to pollen: Secondary | ICD-10-CM | POA: Diagnosis not present

## 2017-07-05 DIAGNOSIS — J3081 Allergic rhinitis due to animal (cat) (dog) hair and dander: Secondary | ICD-10-CM | POA: Diagnosis not present

## 2017-07-11 DIAGNOSIS — J3089 Other allergic rhinitis: Secondary | ICD-10-CM | POA: Diagnosis not present

## 2017-07-11 DIAGNOSIS — J301 Allergic rhinitis due to pollen: Secondary | ICD-10-CM | POA: Diagnosis not present

## 2017-07-17 DIAGNOSIS — J301 Allergic rhinitis due to pollen: Secondary | ICD-10-CM | POA: Diagnosis not present

## 2017-07-17 DIAGNOSIS — J3089 Other allergic rhinitis: Secondary | ICD-10-CM | POA: Diagnosis not present

## 2017-07-24 DIAGNOSIS — J301 Allergic rhinitis due to pollen: Secondary | ICD-10-CM | POA: Diagnosis not present

## 2017-07-24 DIAGNOSIS — J3089 Other allergic rhinitis: Secondary | ICD-10-CM | POA: Diagnosis not present

## 2017-08-03 DIAGNOSIS — J301 Allergic rhinitis due to pollen: Secondary | ICD-10-CM | POA: Diagnosis not present

## 2017-08-03 DIAGNOSIS — J3089 Other allergic rhinitis: Secondary | ICD-10-CM | POA: Diagnosis not present

## 2017-08-03 DIAGNOSIS — J3081 Allergic rhinitis due to animal (cat) (dog) hair and dander: Secondary | ICD-10-CM | POA: Diagnosis not present

## 2017-08-10 DIAGNOSIS — J3089 Other allergic rhinitis: Secondary | ICD-10-CM | POA: Diagnosis not present

## 2017-08-10 DIAGNOSIS — J301 Allergic rhinitis due to pollen: Secondary | ICD-10-CM | POA: Diagnosis not present

## 2017-08-16 DIAGNOSIS — J301 Allergic rhinitis due to pollen: Secondary | ICD-10-CM | POA: Diagnosis not present

## 2017-08-16 DIAGNOSIS — J3089 Other allergic rhinitis: Secondary | ICD-10-CM | POA: Diagnosis not present

## 2017-08-22 DIAGNOSIS — J3081 Allergic rhinitis due to animal (cat) (dog) hair and dander: Secondary | ICD-10-CM | POA: Diagnosis not present

## 2017-08-22 DIAGNOSIS — J3089 Other allergic rhinitis: Secondary | ICD-10-CM | POA: Diagnosis not present

## 2017-08-22 DIAGNOSIS — J301 Allergic rhinitis due to pollen: Secondary | ICD-10-CM | POA: Diagnosis not present

## 2017-08-29 DIAGNOSIS — J3089 Other allergic rhinitis: Secondary | ICD-10-CM | POA: Diagnosis not present

## 2017-08-29 DIAGNOSIS — J301 Allergic rhinitis due to pollen: Secondary | ICD-10-CM | POA: Diagnosis not present

## 2017-09-04 DIAGNOSIS — J3089 Other allergic rhinitis: Secondary | ICD-10-CM | POA: Diagnosis not present

## 2017-09-04 DIAGNOSIS — J301 Allergic rhinitis due to pollen: Secondary | ICD-10-CM | POA: Diagnosis not present

## 2017-09-04 DIAGNOSIS — J3081 Allergic rhinitis due to animal (cat) (dog) hair and dander: Secondary | ICD-10-CM | POA: Diagnosis not present

## 2017-09-10 DIAGNOSIS — J301 Allergic rhinitis due to pollen: Secondary | ICD-10-CM | POA: Diagnosis not present

## 2017-09-10 DIAGNOSIS — J3089 Other allergic rhinitis: Secondary | ICD-10-CM | POA: Diagnosis not present

## 2017-09-11 DIAGNOSIS — J301 Allergic rhinitis due to pollen: Secondary | ICD-10-CM | POA: Diagnosis not present

## 2017-09-11 DIAGNOSIS — J3089 Other allergic rhinitis: Secondary | ICD-10-CM | POA: Diagnosis not present

## 2017-09-18 DIAGNOSIS — J301 Allergic rhinitis due to pollen: Secondary | ICD-10-CM | POA: Diagnosis not present

## 2017-09-18 DIAGNOSIS — J3089 Other allergic rhinitis: Secondary | ICD-10-CM | POA: Diagnosis not present

## 2017-09-23 ENCOUNTER — Other Ambulatory Visit: Payer: Self-pay | Admitting: Family

## 2017-09-24 NOTE — Telephone Encounter (Signed)
30 day supply of metoprolol and losartan sent to pharmacy. Pt is due for f/u with PCP and needs appointment for further refills. Sent mychart message to pt to schedule appt soon.

## 2017-09-25 DIAGNOSIS — J301 Allergic rhinitis due to pollen: Secondary | ICD-10-CM | POA: Diagnosis not present

## 2017-09-25 DIAGNOSIS — J3089 Other allergic rhinitis: Secondary | ICD-10-CM | POA: Diagnosis not present

## 2017-10-02 DIAGNOSIS — J3089 Other allergic rhinitis: Secondary | ICD-10-CM | POA: Diagnosis not present

## 2017-10-02 DIAGNOSIS — J301 Allergic rhinitis due to pollen: Secondary | ICD-10-CM | POA: Diagnosis not present

## 2017-10-02 DIAGNOSIS — J3081 Allergic rhinitis due to animal (cat) (dog) hair and dander: Secondary | ICD-10-CM | POA: Diagnosis not present

## 2017-10-11 DIAGNOSIS — J3089 Other allergic rhinitis: Secondary | ICD-10-CM | POA: Diagnosis not present

## 2017-10-11 DIAGNOSIS — J3081 Allergic rhinitis due to animal (cat) (dog) hair and dander: Secondary | ICD-10-CM | POA: Diagnosis not present

## 2017-10-11 DIAGNOSIS — J301 Allergic rhinitis due to pollen: Secondary | ICD-10-CM | POA: Diagnosis not present

## 2017-10-16 ENCOUNTER — Ambulatory Visit: Payer: PPO | Admitting: Family

## 2017-10-19 DIAGNOSIS — J3089 Other allergic rhinitis: Secondary | ICD-10-CM | POA: Diagnosis not present

## 2017-10-19 DIAGNOSIS — J3081 Allergic rhinitis due to animal (cat) (dog) hair and dander: Secondary | ICD-10-CM | POA: Diagnosis not present

## 2017-10-19 DIAGNOSIS — J301 Allergic rhinitis due to pollen: Secondary | ICD-10-CM | POA: Diagnosis not present

## 2017-10-22 ENCOUNTER — Other Ambulatory Visit: Payer: Self-pay | Admitting: Family

## 2017-10-23 DIAGNOSIS — J3081 Allergic rhinitis due to animal (cat) (dog) hair and dander: Secondary | ICD-10-CM | POA: Diagnosis not present

## 2017-10-23 DIAGNOSIS — J301 Allergic rhinitis due to pollen: Secondary | ICD-10-CM | POA: Diagnosis not present

## 2017-10-23 DIAGNOSIS — J3089 Other allergic rhinitis: Secondary | ICD-10-CM | POA: Diagnosis not present

## 2017-10-24 ENCOUNTER — Encounter: Payer: Self-pay | Admitting: Family

## 2017-10-24 ENCOUNTER — Telehealth: Payer: Self-pay | Admitting: Family

## 2017-10-24 ENCOUNTER — Ambulatory Visit: Payer: PPO | Admitting: Family

## 2017-10-24 VITALS — BP 161/78 | HR 54 | Temp 97.8°F | Resp 16 | Ht 71.0 in | Wt 170.6 lb

## 2017-10-24 DIAGNOSIS — J309 Allergic rhinitis, unspecified: Secondary | ICD-10-CM | POA: Diagnosis not present

## 2017-10-24 DIAGNOSIS — I10 Essential (primary) hypertension: Secondary | ICD-10-CM | POA: Diagnosis not present

## 2017-10-24 DIAGNOSIS — S0300XD Dislocation of jaw, unspecified side, subsequent encounter: Secondary | ICD-10-CM

## 2017-10-24 DIAGNOSIS — E875 Hyperkalemia: Secondary | ICD-10-CM

## 2017-10-24 DIAGNOSIS — R739 Hyperglycemia, unspecified: Secondary | ICD-10-CM

## 2017-10-24 LAB — BASIC METABOLIC PANEL
BUN: 12 mg/dL (ref 6–23)
CALCIUM: 10.1 mg/dL (ref 8.4–10.5)
CO2: 31 mEq/L (ref 19–32)
Chloride: 95 mEq/L — ABNORMAL LOW (ref 96–112)
Creatinine, Ser: 1.09 mg/dL (ref 0.40–1.50)
GFR: 71.01 mL/min (ref >60.00)
GLUCOSE: 113 mg/dL — AB (ref 70–99)
POTASSIUM: 5.9 meq/L — AB (ref 3.5–5.1)
SODIUM: 131 meq/L — AB (ref 135–145)

## 2017-10-24 LAB — HEMOGLOBIN A1C: HEMOGLOBIN A1C: 5.6 % (ref 4.6–6.5)

## 2017-10-24 MED ORDER — HYDROCHLOROTHIAZIDE 25 MG PO TABS: 25.0000 mg | ORAL_TABLET | Freq: Every day | ORAL | 3 refills | Status: DC

## 2017-10-24 MED ORDER — LOSARTAN POTASSIUM 100 MG PO TABS: 100.0000 mg | ORAL_TABLET | Freq: Every day | ORAL | 3 refills | Status: DC

## 2017-10-24 MED ORDER — CARVEDILOL 6.25 MG PO TABS
6.2500 mg | ORAL_TABLET | Freq: Two times a day (BID) | ORAL | 3 refills | Status: DC
Start: 2017-10-24 — End: 2018-02-08

## 2017-10-24 NOTE — Patient Instructions (Signed)
Please increase losartan from 50mg  to 100mg  once daily.  Complete lab work Programmer, systems ot leaving.

## 2017-10-24 NOTE — Progress Notes (Signed)
Subjective:    Patient ID: Gregory Brennan, male    DOB: 03/18/1946, 72 y.o.   MRN: [id number]  HPI  Patient is a 72 yr old male who presents today for follow up.  1) HTN- BP meds include losartan and metoprolol xl 25mg  once daily. He reports good compliance with his medications.   BP Readings from Last 3 Encounters:  10/24/17 (!) 161/78  01/18/17 130/76  09/04/16 (!) 147/76   2) Hyperglycemia-  Lab Results  Component Value Date   HGBA1C 5.3 10/04/2015   3) allergic rhinitis- he is doing allergy shots.  Not needing flonase, only occasionally uses zyrtec. Sees Dr. Donneta Romberg.  4) TMJ- resolved.  Did some exercises which helped.   Review of Systems ee HPI  Past Medical History:  Diagnosis Date  . Allergy   . Heart murmur   . Hypertension   . MRSA (methicillin resistant staph aureus) culture positive 2007  . Rheumatic fever    age 55     Social History   Socioeconomic History  . Marital status: Married    Spouse name: Not on file  . Number of children: Not on file  . Years of education: Not on file  . Highest education level: Not on file  Occupational History  . Not on file  Social Needs  . Financial resource strain: Not on file  . Food insecurity:    Worry: Not on file    Inability: Not on file  . Transportation needs:    Medical: Not on file    Non-medical: Not on file  Tobacco Use  . Smoking status: Never Smoker  . Smokeless tobacco: Never Used  Substance and Sexual Activity  . Alcohol use: No  . Drug use: No  . Sexual activity: Yes  Lifestyle  . Physical activity:    Days per week: Not on file    Minutes per session: Not on file  . Stress: Not on file  Relationships  . Social connections:    Talks on phone: Not on file    Gets together: Not on file    Attends religious service: Not on file    Active member of club or organization: Not on file    Attends meetings of clubs or organizations: Not on file    Relationship status: Not on file  .  Intimate partner violence:    Fear of current or ex partner: Not on file    Emotionally abused: Not on file    Physically abused: Not on file    Forced sexual activity: Not on file  Other Topics Concern  . 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 ABDOMINAL HERNIA REPAIR  12/31/2011    Family History  Problem Relation Age of Onset  . Emphysema Mother   . Hypertension Father   . Colon cancer Neg Hx   . Colon polyps Neg Hx   . Esophageal cancer Neg Hx   . Rectal cancer Neg Hx   . Stomach cancer Neg Hx     Allergies  Allergen Reactions  . Penicillins Rash    Current Outpatient Medications on File  Prior to Visit  Medication Sig Dispense Refill  . cetirizine (ZYRTEC) 10 MG tablet Take 10 mg by mouth daily.    . fluticasone (FLONASE) 50 MCG/ACT nasal spray Place 1 spray into both nostrils daily as needed.     Marland Kitchen losartan (COZAAR) 50 MG tablet TAKE 1 TABLET BY MOUTH EVERY DAY 30 tablet 0  . meloxicam (MOBIC) 7.5 MG tablet Take 1 tablet (7.5 mg total) by mouth daily. 14 tablet 0  . metoprolol succinate (TOPROL-XL) 25 MG 24 hr tablet TAKE 1 TABLET BY MOUTH EVERY DAY 30 tablet 0   No current facility-administered medications on file prior to visit.     BP (!) 161/78 (BP Location: Left Arm, Cuff Size: Normal)   Pulse (!) 54   Temp 97.8 F (36.6 C) (Oral)   Resp 16   Ht 5\' 11"  (1.803 m)   Wt 170 lb 9.6 oz (77.4 kg)   SpO2 100%   BMI 23.79 kg/m       Objective:   Physical Exam  Constitutional: He is oriented to person, place, and time. He appears well-developed and well-nourished. No distress.  HENT:  Head: Normocephalic and atraumatic.  Cardiovascular: Normal rate and regular rhythm.  No  murmur heard. Pulmonary/Chest: Effort normal and breath sounds normal. No respiratory distress. He has no wheezes. He has no rales.  Musculoskeletal: He exhibits no edema.  Neurological: He is alert and oriented to person, place, and time.  Skin: Skin is warm and dry.  Psychiatric: He has a normal mood and affect. His behavior is normal. Thought content normal.          Assessment & Plan:  HTN- uncontrolled. Will increase losartan from 50mg  to 100mg  once daily. Repeat bmet today.  Follow up in 1 month.  Hyperglycemia- check follow up a1c.  Allergic rhinitis- improved with allergy shots- management per allergy.   TMJ- symptoms resolved. Monitor.

## 2017-10-24 NOTE — Telephone Encounter (Signed)
Please contact patient and let him know that his potassium is quite high.  I would like him to stop losartan because losartan may be the cause for his high potassium.  I would like to make the following changes to his medication stop losartan, start hydrochlorothiazide 25 mg by mouth once daily.  This is a fluid pill and should be given in the morning.  I would also like for him to stop Toprol-XL and instead begin Coreg 6.25 mg twice daily for blood pressure.  This is a similar medication to the metoprolol but is a little bit stronger in controlling blood pressure.  He should follow-up on Monday for nurse visit and will need blood pressure checked basic metabolic panel checked diagnosis hyperkalemia and heart rate checked.  If he has palpitations or chest pain at any time he should proceed to the ER.

## 2017-10-24 NOTE — Telephone Encounter (Signed)
Notified pt of below and he voices understanding. He states that he has been eating 1 potassium every day and wonders if that could be causing the elevated potassium more than the medication. Advised pt per verbal from PCP, she feels medication is more of the cause. Received approval from RN to bring pt in on Monday at 10:30am for BP / HR check and lab appt is scheduled immediately following. Future lab order entered.

## 2017-10-25 ENCOUNTER — Telehealth: Payer: Self-pay | Admitting: Family

## 2017-10-25 NOTE — Telephone Encounter (Signed)
Opened in error

## 2017-10-25 NOTE — Telephone Encounter (Signed)
Noted  

## 2017-10-26 DIAGNOSIS — J3089 Other allergic rhinitis: Secondary | ICD-10-CM | POA: Diagnosis not present

## 2017-10-26 DIAGNOSIS — J3081 Allergic rhinitis due to animal (cat) (dog) hair and dander: Secondary | ICD-10-CM | POA: Diagnosis not present

## 2017-10-26 DIAGNOSIS — J301 Allergic rhinitis due to pollen: Secondary | ICD-10-CM | POA: Diagnosis not present

## 2017-10-29 ENCOUNTER — Ambulatory Visit (INDEPENDENT_AMBULATORY_CARE_PROVIDER_SITE_OTHER): Payer: PPO | Admitting: Family

## 2017-10-29 ENCOUNTER — Telehealth: Payer: Self-pay | Admitting: Family

## 2017-10-29 ENCOUNTER — Other Ambulatory Visit (INDEPENDENT_AMBULATORY_CARE_PROVIDER_SITE_OTHER): Payer: PPO

## 2017-10-29 VITALS — BP 130/84 | HR 51

## 2017-10-29 DIAGNOSIS — E875 Hyperkalemia: Secondary | ICD-10-CM

## 2017-10-29 DIAGNOSIS — E871 Hypo-osmolality and hyponatremia: Secondary | ICD-10-CM

## 2017-10-29 DIAGNOSIS — I10 Essential (primary) hypertension: Secondary | ICD-10-CM

## 2017-10-29 LAB — BASIC METABOLIC PANEL
BUN: 9 mg/dL (ref 6–23)
CALCIUM: 9.6 mg/dL (ref 8.4–10.5)
CO2: 32 meq/L (ref 19–32)
Chloride: 89 mEq/L — ABNORMAL LOW (ref 96–112)
Creatinine, Ser: 1 mg/dL (ref 0.40–1.50)
GFR: 78.43 mL/min (ref >60.00)
GLUCOSE: 91 mg/dL (ref 70–99)
POTASSIUM: 4.1 meq/L (ref 3.5–5.1)
SODIUM: 126 meq/L — AB (ref 135–145)

## 2017-10-29 MED ORDER — HYDRALAZINE HCL 25 MG PO TABS: 25.0000 mg | ORAL_TABLET | Freq: Three times a day (TID) | ORAL | 2 refills | Status: DC

## 2017-10-29 NOTE — Progress Notes (Signed)
Pre visit review using our clinic tool,if applicable. No additional management support is needed unless otherwise documented below in the visit note.   Pt here for Blood pressure check per Camille Bal dated 10/24/17.  Pt currently takes: HCTZ 25 mg daily and Coreg 6.25 mg bid.   Pt reports compliance with medication. Bp's taken at home have been in the  128-118 range systolic and 75 dyastolic  Pulse has been in the 55's. Patient states he goes to the gym 5 days out of 7.  BP today @ = 130/84 HR = 51  Pt advised per Melissa O'SUllivan to continue medications as ordered and return in 1 month for follow up. Patient has appointment scheduled. Going to the lab for BMP today.

## 2017-10-29 NOTE — Telephone Encounter (Signed)
HCTZ has caused his sodium to drop.  Though potassium looks better.  I would like him to continue coreg, d/c hctz and instead begin hydralazine. (this is a 3 time a day med- but unfortunately we don't have a lot of other options based on his previous drug intolerances) Follow up in 1 week for repeat nurse visit bp check and bmet.

## 2017-10-29 NOTE — Progress Notes (Signed)
Reviewed and agree.

## 2017-10-30 NOTE — Telephone Encounter (Signed)
Author returned pt's phone call.  Pt. stated he does not like using salt on his food, but has been told in the past to use it, so "I guess I'll eat more of it again". New medication, hydralazine, reviewed with patient, and NV and lab visit set up for 8/22.

## 2017-10-30 NOTE — Telephone Encounter (Signed)
Pt returning your call. Please call [phone]

## 2017-10-30 NOTE — Telephone Encounter (Signed)
Author phoned pt to review lab results and Melissa, NP's recommendation. No answer. Author left VM asking for return phone call [phone].

## 2017-10-31 DIAGNOSIS — J3089 Other allergic rhinitis: Secondary | ICD-10-CM | POA: Diagnosis not present

## 2017-10-31 DIAGNOSIS — J301 Allergic rhinitis due to pollen: Secondary | ICD-10-CM | POA: Diagnosis not present

## 2017-10-31 DIAGNOSIS — J3081 Allergic rhinitis due to animal (cat) (dog) hair and dander: Secondary | ICD-10-CM | POA: Diagnosis not present

## 2017-11-02 DIAGNOSIS — J3081 Allergic rhinitis due to animal (cat) (dog) hair and dander: Secondary | ICD-10-CM | POA: Diagnosis not present

## 2017-11-02 DIAGNOSIS — J301 Allergic rhinitis due to pollen: Secondary | ICD-10-CM | POA: Diagnosis not present

## 2017-11-02 DIAGNOSIS — J3089 Other allergic rhinitis: Secondary | ICD-10-CM | POA: Diagnosis not present

## 2017-11-08 ENCOUNTER — Other Ambulatory Visit (INDEPENDENT_AMBULATORY_CARE_PROVIDER_SITE_OTHER): Payer: PPO

## 2017-11-08 ENCOUNTER — Ambulatory Visit (INDEPENDENT_AMBULATORY_CARE_PROVIDER_SITE_OTHER): Payer: PPO

## 2017-11-08 ENCOUNTER — Other Ambulatory Visit: Payer: Self-pay

## 2017-11-08 VITALS — BP 148/88 | HR 51

## 2017-11-08 DIAGNOSIS — E871 Hypo-osmolality and hyponatremia: Secondary | ICD-10-CM | POA: Diagnosis not present

## 2017-11-08 DIAGNOSIS — I10 Essential (primary) hypertension: Secondary | ICD-10-CM | POA: Diagnosis not present

## 2017-11-08 MED ORDER — HYDRALAZINE HCL 25 MG PO TABS: 25.0000 mg | ORAL_TABLET | Freq: Four times a day (QID) | ORAL | 2 refills | Status: DC

## 2017-11-08 NOTE — Progress Notes (Addendum)
Pre visit review using our clinic tool,if applicable. No additional management support is needed unless otherwise documented below in the visit note.   Pt here for Blood pressure check per   Pt currently takes: Coreg 6.25 mg bid,Hydralizine 25 mg qid   Pt reports compliance with medication.  BP today @ = 148/88 P =51  Pt advised per Dr. Charlett Blake increase Hydralazine to QID continue Coreg as ordered.  Nursing BP check note reviewed. Agree with documention and plan.

## 2017-11-09 LAB — BASIC METABOLIC PANEL
BUN: 13 mg/dL (ref 6–23)
CALCIUM: 9.2 mg/dL (ref 8.4–10.5)
CO2: 28 meq/L (ref 19–32)
CREATININE: 1.04 mg/dL (ref 0.40–1.50)
Chloride: 97 mEq/L (ref 96–112)
GFR: 74.95 mL/min (ref >60.00)
GLUCOSE: 99 mg/dL (ref 70–99)
Potassium: 3.9 mEq/L (ref 3.5–5.1)
SODIUM: 132 meq/L — AB (ref 135–145)

## 2017-11-15 ENCOUNTER — Ambulatory Visit (INDEPENDENT_AMBULATORY_CARE_PROVIDER_SITE_OTHER): Payer: PPO | Admitting: Family Medicine

## 2017-11-15 VITALS — BP 154/88 | HR 51

## 2017-11-15 DIAGNOSIS — J301 Allergic rhinitis due to pollen: Secondary | ICD-10-CM | POA: Diagnosis not present

## 2017-11-15 DIAGNOSIS — I10 Essential (primary) hypertension: Secondary | ICD-10-CM | POA: Diagnosis not present

## 2017-11-15 DIAGNOSIS — J3081 Allergic rhinitis due to animal (cat) (dog) hair and dander: Secondary | ICD-10-CM | POA: Diagnosis not present

## 2017-11-15 DIAGNOSIS — J3089 Other allergic rhinitis: Secondary | ICD-10-CM | POA: Diagnosis not present

## 2017-11-15 NOTE — Progress Notes (Signed)
Pre visit review using our clinic tool,if applicable. No additional management support is needed unless otherwise documented below in the visit note.   Pt here for Blood pressure check per ordewr from M. O'Sulivan NP.  Pt currently takes: Hydralizine 25 mg qid, Carvedilol 6.25 mg bid.   Pt reports compliance with medication.   BP today @ = 154/88 HR =51  Pt advised per Dr. Lenna Sciara. [facility] DOD,  to come in for OV to see Quentin Mulling NP for BP follow up. Appointment scheduled.

## 2017-11-15 NOTE — Progress Notes (Signed)
I have reviewed the associated BP note with Ms. Eulas Post and agree with her documentation

## 2017-11-20 ENCOUNTER — Ambulatory Visit (INDEPENDENT_AMBULATORY_CARE_PROVIDER_SITE_OTHER): Payer: PPO | Admitting: Family

## 2017-11-20 ENCOUNTER — Telehealth: Payer: Self-pay | Admitting: Family

## 2017-11-20 VITALS — BP 164/88 | HR 52 | Temp 98.0°F | Resp 16 | Ht 71.0 in | Wt 168.2 lb

## 2017-11-20 DIAGNOSIS — I10 Essential (primary) hypertension: Secondary | ICD-10-CM

## 2017-11-20 MED ORDER — AMLODIPINE BESYLATE 2.5 MG PO TABS: 2.5000 mg | ORAL_TABLET | Freq: Every day | ORAL | 3 refills | Status: DC

## 2017-11-20 MED ORDER — HYDRALAZINE HCL 25 MG PO TABS: 25.0000 mg | ORAL_TABLET | Freq: Four times a day (QID) | ORAL | 1 refills | Status: DC

## 2017-11-20 NOTE — Telephone Encounter (Signed)
Copied from [place] [id number]. Topic: Quick Communication - See Telephone Encounter >> Nov 20, 2017  2:20 PM Conception Chancy, NT wrote: CRM for notification. See Telephone encounter for: 11/20/17.  Patient is calling in regards to hydrALAZINE (APRESOLINE) 25 MG tablet. He states it has increased to 4x a day, but it was only 30 tablets called in. He is needing this refilled for atleast a 30 day supply but would like a 90 day supply. Please advise.  [id number] [address], [place] - [address] [place] [address] Phone: [phone] Fax: [phone]

## 2017-11-20 NOTE — Patient Instructions (Signed)
Add amlodipine 2.5mg  once daily to your routine.   Send me your readings via mychart in 1 week.

## 2017-11-20 NOTE — Progress Notes (Signed)
Subjective:    Patient ID: Gregory Brennan, male    DOB: 03-28-1945, 72 y.o.   MRN: [id number]  HPI  Patient is a 72 year old male who presents today for follow-up of his hypertension.  I last saw him back on August 7.  At that time we increased his losartan dose from 50 mg 200 mg once daily.  Unfortunately on his follow-up basic metabolic panel he was noted to be hyperkalemic.  We stopped his losartan and began hydrochlorothiazide once daily.  In addition we changed his Toprol-XL to Coreg.  He return for nurse visit on August 12.  Blood pressure was 130/84 and heart rate was 51.  Though his blood pressure was stable on this regimen hydrochlorothiazide caused his sodium to drop.  As a result we stopped his hydrochlorothiazide and placed him instead on hydralazine.  He returned on August 22 for nurse visit blood pressure check and blood pressure at that time was 148/88.  His hydralazine was increased to 4 times daily and he continue Coreg as ordered.  BP Readings from Last 3 Encounters:  11/20/17 (!) 164/88  11/15/17 (!) 154/88  11/08/17 (!) 148/88   He brings with him today his home blood pressure readings which range 124-146/70-84.   Review of Systems    see HPI  Past Medical History:  Diagnosis Date  . Allergy   . Heart murmur   . Hypertension   . MRSA (methicillin resistant staph aureus) culture positive 2007  . Rheumatic fever    age 64     Social History   Socioeconomic History  . Marital status: Married    Spouse name: Not on file  . Number of children: Not on file  . Years of education: Not on file  . Highest education level: Not on file  Occupational History  . Not on file  Social Needs  . Financial resource strain: Not on file  . Food insecurity:    Worry: Not on file    Inability: Not on file  . Transportation needs:    Medical: Not on file    Non-medical: Not on file  Tobacco Use  . Smoking status: Never Smoker  . Smokeless tobacco: Never Used  Substance  and Sexual Activity  . Alcohol use: No  . Drug use: No  . Sexual activity: Yes  Lifestyle  . Physical activity:    Days per week: Not on file    Minutes per session: Not on file  . Stress: Not on file  Relationships  . Social connections:    Talks on phone: Not on file    Gets together: Not on file    Attends religious service: Not on file    Active member of club or organization: Not on file    Attends meetings of clubs or organizations: Not on file    Relationship status: Not on file  . Intimate partner violence:    Fear of current or ex partner: Not on file    Emotionally abused: Not on file    Physically abused: Not on file    Forced sexual activity: Not on file  Other Topics Concern  . 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 ABDOMINAL HERNIA REPAIR  12/31/2011    Family History  Problem Relation Age of Onset  . Emphysema Mother   . Hypertension Father   . Colon cancer Neg Hx   . Colon polyps Neg Hx   . Esophageal cancer Neg Hx   . Rectal cancer Neg Hx   . Stomach cancer Neg Hx     Allergies  Allergen Reactions  . Amlodipine     swelling  . Hctz [Hydrochlorothiazide]     Hyponatremia   . Losartan     Hyperkalemia   . Penicillins Rash    Current Outpatient Medications on File Prior to Visit  Medication Sig Dispense Refill  . carvedilol (COREG) 6.25 MG tablet Take 1 tablet (6.25 mg total) by mouth 2 (two) times daily with a meal. 60 tablet 3  . cetirizine (ZYRTEC) 10 MG tablet Take 10 mg by mouth daily.    . fluticasone (FLONASE) 50 MCG/ACT nasal spray Place 1 spray into both nostrils daily as needed.     . hydrALAZINE (APRESOLINE) 25 MG tablet Take 1 tablet (25 mg  total) by mouth 4 (four) times daily. 30 tablet 2   No current facility-administered medications on file prior to visit.     BP (!) 164/88 (BP Location: Right Arm, Cuff Size: Normal)   Pulse (!) 52   Temp 98 F (36.7 C) (Oral)   Resp 16   Ht 5\' 11"  (1.803 m)   Wt 168 lb 3.2 oz (76.3 kg)   SpO2 100%   BMI 23.46 kg/m    Objective:   Physical Exam  Constitutional: He is oriented to person, place, and time. He appears well-developed and well-nourished. No distress.  HENT:  Head: Normocephalic and atraumatic.  Cardiovascular: Normal rate and regular rhythm.  No murmur heard. Pulmonary/Chest: Effort normal and breath sounds normal. No respiratory distress. He has no wheezes. He has no rales.  Musculoskeletal: He exhibits no edema.  Neurological: He is alert and oriented to person, place, and time.  Skin: Skin is warm and dry.  Psychiatric: He has a normal mood and affect. His behavior is normal. Thought content normal.          Assessment & Plan:  HTN-Uncontrolled. I repeated his blood pressure manually during his visit and my follow-up blood pressure reading was 168/100.  There may be a whitecoat hypertension component to this however his blood pressure is quite high today.  We have had multiple challenges with other medications.  Amlodipine worked very well for him but caused him some lower extremity edema.  He reports that he is okay with restarting the amlodipine if needed.  I suggested that we add amlodipine at a lower dose of 2.5 mg daily to his current regimen.  I have asked him to send me his readings in 1 week via my chart.  He will follow-up in the office in 1 month.

## 2017-11-20 NOTE — Telephone Encounter (Signed)
Rx re-sent and pt has been notified.

## 2017-11-21 ENCOUNTER — Ambulatory Visit: Payer: PPO | Admitting: Family

## 2017-11-22 DIAGNOSIS — J3089 Other allergic rhinitis: Secondary | ICD-10-CM | POA: Diagnosis not present

## 2017-11-22 DIAGNOSIS — J301 Allergic rhinitis due to pollen: Secondary | ICD-10-CM | POA: Diagnosis not present

## 2017-11-22 DIAGNOSIS — J3081 Allergic rhinitis due to animal (cat) (dog) hair and dander: Secondary | ICD-10-CM | POA: Diagnosis not present

## 2017-12-03 ENCOUNTER — Ambulatory Visit: Payer: PPO | Admitting: Family

## 2017-12-07 DIAGNOSIS — J3089 Other allergic rhinitis: Secondary | ICD-10-CM | POA: Diagnosis not present

## 2017-12-07 DIAGNOSIS — J301 Allergic rhinitis due to pollen: Secondary | ICD-10-CM | POA: Diagnosis not present

## 2017-12-07 DIAGNOSIS — J3081 Allergic rhinitis due to animal (cat) (dog) hair and dander: Secondary | ICD-10-CM | POA: Diagnosis not present

## 2017-12-14 ENCOUNTER — Encounter: Payer: Self-pay | Admitting: Family

## 2017-12-14 DIAGNOSIS — J3089 Other allergic rhinitis: Secondary | ICD-10-CM | POA: Diagnosis not present

## 2017-12-14 DIAGNOSIS — J301 Allergic rhinitis due to pollen: Secondary | ICD-10-CM | POA: Diagnosis not present

## 2017-12-14 DIAGNOSIS — J3081 Allergic rhinitis due to animal (cat) (dog) hair and dander: Secondary | ICD-10-CM | POA: Diagnosis not present

## 2017-12-20 ENCOUNTER — Encounter: Payer: Self-pay | Admitting: Family

## 2017-12-21 ENCOUNTER — Ambulatory Visit (INDEPENDENT_AMBULATORY_CARE_PROVIDER_SITE_OTHER): Payer: PPO | Admitting: Family

## 2017-12-21 ENCOUNTER — Encounter: Payer: Self-pay | Admitting: Family

## 2017-12-21 VITALS — BP 135/77 | HR 50 | Temp 98.3°F | Resp 16 | Ht 71.0 in | Wt 168.4 lb

## 2017-12-21 DIAGNOSIS — J301 Allergic rhinitis due to pollen: Secondary | ICD-10-CM | POA: Diagnosis not present

## 2017-12-21 DIAGNOSIS — J3089 Other allergic rhinitis: Secondary | ICD-10-CM | POA: Diagnosis not present

## 2017-12-21 DIAGNOSIS — J3081 Allergic rhinitis due to animal (cat) (dog) hair and dander: Secondary | ICD-10-CM | POA: Diagnosis not present

## 2017-12-21 DIAGNOSIS — I10 Essential (primary) hypertension: Secondary | ICD-10-CM | POA: Diagnosis not present

## 2017-12-21 DIAGNOSIS — Z23 Encounter for immunization: Secondary | ICD-10-CM

## 2017-12-21 NOTE — Progress Notes (Signed)
Subjective:    Patient ID: Gregory Brennan, male    DOB: Jul 25, 1945, 72 y.o.   MRN: [id number]  HPI  Patient presents today for follow up.  HTN- last visit we added amlodipine 2.5mg  to his coreg/hydralazine.  Feels better now that his bp is improved.  Previously had periods of lightheadedness/dizziness.  Denies LE edema.   BP Readings from Last 3 Encounters:  12/21/17 135/77  11/20/17 (!) 164/88  11/15/17 (!) 154/88      Review of Systems See HPI  Past Medical History:  Diagnosis Date  . Allergy   . Heart murmur   . Hypertension   . MRSA (methicillin resistant staph aureus) culture positive 2007  . Rheumatic fever    age 73     Social History   Socioeconomic History  . Marital status: Married    Spouse name: Not on file  . Number of children: Not on file  . Years of education: Not on file  . Highest education level: Not on file  Occupational History  . Not on file  Social Needs  . Financial resource strain: Not on file  . Food insecurity:    Worry: Not on file    Inability: Not on file  . Transportation needs:    Medical: Not on file    Non-medical: Not on file  Tobacco Use  . Smoking status: Never Smoker  . Smokeless tobacco: Never Used  Substance and Sexual Activity  . Alcohol use: No  . Drug use: No  . Sexual activity: Yes  Lifestyle  . Physical activity:    Days per week: Not on file    Minutes per session: Not on file  . Stress: Not on file  Relationships  . Social connections:    Talks on phone: Not on file    Gets together: Not on file    Attends religious service: Not on file    Active member of club or organization: Not on file    Attends meetings of clubs or organizations: Not on file    Relationship status: Not on file  . Intimate partner violence:    Fear of current or ex partner: Not on file    Emotionally abused: Not on file    Physically abused: Not on file    Forced sexual activity: Not on file  Other Topics Concern  . 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 ABDOMINAL HERNIA REPAIR  12/31/2011    Family History  Problem Relation Age of Onset  . Emphysema Mother   . Hypertension Father   . Colon cancer Neg Hx   . Colon polyps Neg Hx   . Esophageal cancer Neg Hx   . Rectal cancer Neg Hx   . Stomach cancer Neg Hx     Allergies  Allergen Reactions  . Amlodipine     swelling  . Hctz [Hydrochlorothiazide]     Hyponatremia   . Losartan     Hyperkalemia   . Penicillins Rash    Current Outpatient Medications on File Prior to Visit  Medication Sig Dispense Refill  . amLODipine (NORVASC) 2.5 MG  tablet Take 1 tablet (2.5 mg total) by mouth daily. 30 tablet 3  . carvedilol (COREG) 6.25 MG tablet Take 1 tablet (6.25 mg total) by mouth 2 (two) times daily with a meal. 60 tablet 3  . cetirizine (ZYRTEC) 10 MG tablet Take 10 mg by mouth daily.    . fluticasone (FLONASE) 50 MCG/ACT nasal spray Place 1 spray into both nostrils daily as needed.     . hydrALAZINE (APRESOLINE) 25 MG tablet Take 1 tablet (25 mg total) by mouth 4 (four) times daily. 360 tablet 1   No current facility-administered medications on file prior to visit.     BP 135/77 (BP Location: Right Arm, Patient Position: Sitting, Cuff Size: Small)   Pulse (!) 50   Temp 98.3 F (36.8 C) (Oral)   Resp 16   Ht 5\' 11"  (1.803 m)   Wt 168 lb 6.4 oz (76.4 kg)   SpO2 100%   BMI 23.49 kg/m       Objective:   Physical Exam  Constitutional: He is oriented to person, place, and time. He appears well-developed and well-nourished. No distress.  HENT:  Head: Normocephalic and atraumatic.  Cardiovascular: Normal rate and regular rhythm.  No murmur  heard. Pulmonary/Chest: Effort normal and breath sounds normal. No respiratory distress. He has no wheezes. He has no rales.  Musculoskeletal: He exhibits no edema.  Neurological: He is alert and oriented to person, place, and time.  Skin: Skin is warm and dry.  Psychiatric: He has a normal mood and affect. His behavior is normal. Thought content normal.          Assessment & Plan:  HTN- BP is stable. Continue current meds.

## 2017-12-28 DIAGNOSIS — J3089 Other allergic rhinitis: Secondary | ICD-10-CM | POA: Diagnosis not present

## 2017-12-28 DIAGNOSIS — J3081 Allergic rhinitis due to animal (cat) (dog) hair and dander: Secondary | ICD-10-CM | POA: Diagnosis not present

## 2017-12-28 DIAGNOSIS — J301 Allergic rhinitis due to pollen: Secondary | ICD-10-CM | POA: Diagnosis not present

## 2018-01-07 DIAGNOSIS — J3081 Allergic rhinitis due to animal (cat) (dog) hair and dander: Secondary | ICD-10-CM | POA: Diagnosis not present

## 2018-01-07 DIAGNOSIS — J301 Allergic rhinitis due to pollen: Secondary | ICD-10-CM | POA: Diagnosis not present

## 2018-01-07 DIAGNOSIS — J3089 Other allergic rhinitis: Secondary | ICD-10-CM | POA: Diagnosis not present

## 2018-01-16 ENCOUNTER — Other Ambulatory Visit: Payer: Self-pay | Admitting: Family

## 2018-01-17 DIAGNOSIS — J301 Allergic rhinitis due to pollen: Secondary | ICD-10-CM | POA: Diagnosis not present

## 2018-01-17 DIAGNOSIS — J3081 Allergic rhinitis due to animal (cat) (dog) hair and dander: Secondary | ICD-10-CM | POA: Diagnosis not present

## 2018-01-17 DIAGNOSIS — J3089 Other allergic rhinitis: Secondary | ICD-10-CM | POA: Diagnosis not present

## 2018-01-18 ENCOUNTER — Other Ambulatory Visit: Payer: Self-pay | Admitting: Family

## 2018-01-18 ENCOUNTER — Telehealth: Payer: Self-pay | Admitting: Family

## 2018-01-18 NOTE — Telephone Encounter (Signed)
Gregory Brennan -- please advise request for HCTZ. Rx not on current med list.

## 2018-01-18 NOTE — Telephone Encounter (Signed)
See mychart.  

## 2018-01-23 DIAGNOSIS — J3081 Allergic rhinitis due to animal (cat) (dog) hair and dander: Secondary | ICD-10-CM | POA: Diagnosis not present

## 2018-01-23 DIAGNOSIS — J3089 Other allergic rhinitis: Secondary | ICD-10-CM | POA: Diagnosis not present

## 2018-01-23 DIAGNOSIS — J301 Allergic rhinitis due to pollen: Secondary | ICD-10-CM | POA: Diagnosis not present

## 2018-01-31 DIAGNOSIS — J301 Allergic rhinitis due to pollen: Secondary | ICD-10-CM | POA: Diagnosis not present

## 2018-01-31 DIAGNOSIS — J3081 Allergic rhinitis due to animal (cat) (dog) hair and dander: Secondary | ICD-10-CM | POA: Diagnosis not present

## 2018-01-31 DIAGNOSIS — J3089 Other allergic rhinitis: Secondary | ICD-10-CM | POA: Diagnosis not present

## 2018-02-05 ENCOUNTER — Telehealth: Payer: Self-pay

## 2018-02-05 NOTE — Telephone Encounter (Signed)
LMTCB to schedule Medicare AWV

## 2018-02-08 ENCOUNTER — Other Ambulatory Visit: Payer: Self-pay | Admitting: Family

## 2018-02-08 DIAGNOSIS — J3081 Allergic rhinitis due to animal (cat) (dog) hair and dander: Secondary | ICD-10-CM | POA: Diagnosis not present

## 2018-02-08 DIAGNOSIS — J3089 Other allergic rhinitis: Secondary | ICD-10-CM | POA: Diagnosis not present

## 2018-02-08 DIAGNOSIS — J301 Allergic rhinitis due to pollen: Secondary | ICD-10-CM | POA: Diagnosis not present

## 2018-02-11 ENCOUNTER — Other Ambulatory Visit: Payer: Self-pay | Admitting: Family

## 2018-02-12 DIAGNOSIS — J3089 Other allergic rhinitis: Secondary | ICD-10-CM | POA: Diagnosis not present

## 2018-02-12 DIAGNOSIS — J301 Allergic rhinitis due to pollen: Secondary | ICD-10-CM | POA: Diagnosis not present

## 2018-02-12 DIAGNOSIS — J3081 Allergic rhinitis due to animal (cat) (dog) hair and dander: Secondary | ICD-10-CM | POA: Diagnosis not present

## 2018-02-13 DIAGNOSIS — J3081 Allergic rhinitis due to animal (cat) (dog) hair and dander: Secondary | ICD-10-CM | POA: Diagnosis not present

## 2018-02-13 DIAGNOSIS — J301 Allergic rhinitis due to pollen: Secondary | ICD-10-CM | POA: Diagnosis not present

## 2018-02-13 DIAGNOSIS — J3089 Other allergic rhinitis: Secondary | ICD-10-CM | POA: Diagnosis not present

## 2018-02-19 DIAGNOSIS — J3081 Allergic rhinitis due to animal (cat) (dog) hair and dander: Secondary | ICD-10-CM | POA: Diagnosis not present

## 2018-02-19 DIAGNOSIS — J301 Allergic rhinitis due to pollen: Secondary | ICD-10-CM | POA: Diagnosis not present

## 2018-02-19 DIAGNOSIS — J3089 Other allergic rhinitis: Secondary | ICD-10-CM | POA: Diagnosis not present

## 2018-03-01 DIAGNOSIS — J3081 Allergic rhinitis due to animal (cat) (dog) hair and dander: Secondary | ICD-10-CM | POA: Diagnosis not present

## 2018-03-01 DIAGNOSIS — J3089 Other allergic rhinitis: Secondary | ICD-10-CM | POA: Diagnosis not present

## 2018-03-01 DIAGNOSIS — J301 Allergic rhinitis due to pollen: Secondary | ICD-10-CM | POA: Diagnosis not present

## 2018-03-07 DIAGNOSIS — J3089 Other allergic rhinitis: Secondary | ICD-10-CM | POA: Diagnosis not present

## 2018-03-07 DIAGNOSIS — J301 Allergic rhinitis due to pollen: Secondary | ICD-10-CM | POA: Diagnosis not present

## 2018-03-08 ENCOUNTER — Encounter: Payer: Self-pay | Admitting: Family Medicine

## 2018-03-08 ENCOUNTER — Ambulatory Visit (INDEPENDENT_AMBULATORY_CARE_PROVIDER_SITE_OTHER): Payer: PPO | Admitting: Family Medicine

## 2018-03-08 DIAGNOSIS — J0140 Acute pansinusitis, unspecified: Secondary | ICD-10-CM | POA: Diagnosis not present

## 2018-03-08 MED ORDER — DOXYCYCLINE HYCLATE 100 MG PO TABS: 100.0000 mg | ORAL_TABLET | Freq: Two times a day (BID) | ORAL | 0 refills | Status: DC

## 2018-03-08 NOTE — Progress Notes (Signed)
Gregory Brennan - 72 y.o. male MRN [id number]  Date of birth: [birth year]  Subjective Chief Complaint  Patient presents with  . Sinusitis    ongoing for two weeks-pressure and drainage-has been taking Mucinex with no improvement-denies fevers or body aches    HPI  Gregory Brennan is a 72 y.o. male with history of chronic environmental allergies here today with complaint of sinus pain and congestion.  He reports that symptoms began 2 weeks ago.  Describes pressure as a band around his forehead with tenderness in his cheeks and pain in his upper teeth.  Drainage has been green/yellow.  He does not think he has had fever.  He has been using mucinex with mild improvement.  He denies body aches, chest pain, shortness of breath or wheezing.   ROS:  A comprehensive ROS was completed and negative except as noted per HPI  Allergies  Allergen Reactions  . Hctz [Hydrochlorothiazide]     Hyponatremia   . Losartan     Hyperkalemia   . Penicillins Rash    Past Medical History:  Diagnosis Date  . Allergy   . Heart murmur   . Hypertension   . MRSA (methicillin resistant staph aureus) culture positive 2007  . Rheumatic fever    age 72    Past Surgical History:  Procedure Laterality Date  . ABDOMINAL HERNIA REPAIR  12/31/2011    Social History   Socioeconomic History  . Marital status: Married    Spouse name: Not on file  . Number of children: Not on file  . Years of education: Not on file  . Highest education level: Not on file  Occupational History  . Not on file  Social Needs  . Financial resource strain: Not on file  . Food insecurity:    Worry: Not on file    Inability: Not on file  . Transportation needs:    Medical: Not on file    Non-medical: Not on file  Tobacco Use  . Smoking status: Never Smoker  . Smokeless tobacco: Never Used  Substance and Sexual Activity  . Alcohol use: No  . Drug use: No  . Sexual activity: Yes  Lifestyle  . Physical activity:    Days per  week: Not on file    Minutes per session: Not on file  . Stress: Not on file  Relationships  . Social connections:    Talks on phone: Not on file    Gets together: Not on file    Attends religious service: Not on file    Active member of club or organization: Not on file    Attends meetings of clubs or organizations: Not on file    Relationship status: Not on file  Other Topics Concern  . 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 Emphysema Mother   . Hypertension Father   . Colon cancer Neg Hx   . Colon polyps Neg Hx   . Esophageal cancer Neg Hx   . Rectal cancer Neg Hx   . Stomach cancer Neg Hx     Health Maintenance  Topic Date Due  . Hepatitis  C Screening  10/25/2018 (Originally 1946/01/07)  . TETANUS/TDAP  03/20/2018  . COLONOSCOPY  07/07/2019  . INFLUENZA VACCINE  Completed  . PNA vac Low Risk Adult  Completed    ----------------------------------------------------------------------------------------------------------------------------------------------------------------------------------------------------------------- Physical Exam BP 128/66   Pulse 63   Temp 98 F (36.7 C) (Oral)   Ht 5\' 11"  (1.803 m)   Wt 166 lb (75.3 kg)   SpO2 98%   BMI 23.15 kg/m   Physical Exam Constitutional:      Appearance: Normal appearance. He is not toxic-appearing.  HENT:     Head: Normocephalic and atraumatic.     Right Ear: Tympanic membrane and ear canal normal.     Left Ear: Tympanic membrane and ear canal normal.     Nose: Congestion present.     Comments: Maxillary and frontal sinus tenderness Eyes:     General: No scleral icterus. Cardiovascular:     Rate and Rhythm: Normal rate and  regular rhythm.  Pulmonary:     Effort: Pulmonary effort is normal.     Breath sounds: Normal breath sounds.  Lymphadenopathy:     Cervical: No cervical adenopathy.  Skin:    General: Skin is warm and dry.  Neurological:     General: No focal deficit present.     Mental Status: He is alert.  Psychiatric:        Mood and Affect: Mood normal.        Behavior: Behavior normal.     ------------------------------------------------------------------------------------------------------------------------------------------------------------------------------------------------------------------- Assessment and Plan  Acute non-recurrent pansinusitis -PCN allergy noted.  Will treat with doxycycline x10 days Symptomatic therapy suggested: push fluids, rest, use vaporizer or mist prn and return office visit prn if symptoms persist or worsen.  Call or return to clinic prn if these symptoms worsen or fail to improve as anticipated.

## 2018-03-08 NOTE — Patient Instructions (Signed)

## 2018-03-08 NOTE — Assessment & Plan Note (Signed)
-  PCN allergy noted.  Will treat with doxycycline x10 days Symptomatic therapy suggested: push fluids, rest, use vaporizer or mist prn and return office visit prn if symptoms persist or worsen.  Call or return to clinic prn if these symptoms worsen or fail to improve as anticipated.

## 2018-03-22 DIAGNOSIS — J301 Allergic rhinitis due to pollen: Secondary | ICD-10-CM | POA: Diagnosis not present

## 2018-03-22 DIAGNOSIS — J3089 Other allergic rhinitis: Secondary | ICD-10-CM | POA: Diagnosis not present

## 2018-03-22 DIAGNOSIS — J3081 Allergic rhinitis due to animal (cat) (dog) hair and dander: Secondary | ICD-10-CM | POA: Diagnosis not present

## 2018-03-26 DIAGNOSIS — J3081 Allergic rhinitis due to animal (cat) (dog) hair and dander: Secondary | ICD-10-CM | POA: Diagnosis not present

## 2018-03-26 DIAGNOSIS — J3089 Other allergic rhinitis: Secondary | ICD-10-CM | POA: Diagnosis not present

## 2018-03-26 DIAGNOSIS — J301 Allergic rhinitis due to pollen: Secondary | ICD-10-CM | POA: Diagnosis not present

## 2018-04-12 DIAGNOSIS — J301 Allergic rhinitis due to pollen: Secondary | ICD-10-CM | POA: Diagnosis not present

## 2018-04-12 DIAGNOSIS — J3081 Allergic rhinitis due to animal (cat) (dog) hair and dander: Secondary | ICD-10-CM | POA: Diagnosis not present

## 2018-04-12 DIAGNOSIS — J3089 Other allergic rhinitis: Secondary | ICD-10-CM | POA: Diagnosis not present

## 2018-04-19 ENCOUNTER — Other Ambulatory Visit: Payer: Self-pay

## 2018-04-19 DIAGNOSIS — J3081 Allergic rhinitis due to animal (cat) (dog) hair and dander: Secondary | ICD-10-CM | POA: Diagnosis not present

## 2018-04-19 DIAGNOSIS — J301 Allergic rhinitis due to pollen: Secondary | ICD-10-CM | POA: Diagnosis not present

## 2018-04-19 DIAGNOSIS — J3089 Other allergic rhinitis: Secondary | ICD-10-CM | POA: Diagnosis not present

## 2018-04-19 NOTE — Patient Outreach (Signed)
[facility]) Care Management  04/19/2018  Gregory Brennan Mar 26, 1945 [id number]   Medication Adherence call to Gregory Brennan spoke with patient he is due on Losartan 50 mg patient explain he is no longer taking this medication, doctor put him on a new regiment, medication is no longer on his med list.   [facility] Management Direct Dial [phone]  Fax [phone] [email or web address]

## 2018-04-26 DIAGNOSIS — J3089 Other allergic rhinitis: Secondary | ICD-10-CM | POA: Diagnosis not present

## 2018-04-26 DIAGNOSIS — J3081 Allergic rhinitis due to animal (cat) (dog) hair and dander: Secondary | ICD-10-CM | POA: Diagnosis not present

## 2018-04-26 DIAGNOSIS — J301 Allergic rhinitis due to pollen: Secondary | ICD-10-CM | POA: Diagnosis not present

## 2018-05-03 DIAGNOSIS — J3081 Allergic rhinitis due to animal (cat) (dog) hair and dander: Secondary | ICD-10-CM | POA: Diagnosis not present

## 2018-05-03 DIAGNOSIS — J3089 Other allergic rhinitis: Secondary | ICD-10-CM | POA: Diagnosis not present

## 2018-05-03 DIAGNOSIS — J301 Allergic rhinitis due to pollen: Secondary | ICD-10-CM | POA: Diagnosis not present

## 2018-05-10 DIAGNOSIS — J301 Allergic rhinitis due to pollen: Secondary | ICD-10-CM | POA: Diagnosis not present

## 2018-05-10 DIAGNOSIS — J3081 Allergic rhinitis due to animal (cat) (dog) hair and dander: Secondary | ICD-10-CM | POA: Diagnosis not present

## 2018-05-10 DIAGNOSIS — J3089 Other allergic rhinitis: Secondary | ICD-10-CM | POA: Diagnosis not present

## 2018-05-17 DIAGNOSIS — J301 Allergic rhinitis due to pollen: Secondary | ICD-10-CM | POA: Diagnosis not present

## 2018-05-17 DIAGNOSIS — J3089 Other allergic rhinitis: Secondary | ICD-10-CM | POA: Diagnosis not present

## 2018-05-17 DIAGNOSIS — J3081 Allergic rhinitis due to animal (cat) (dog) hair and dander: Secondary | ICD-10-CM | POA: Diagnosis not present

## 2018-05-20 ENCOUNTER — Encounter: Payer: Self-pay | Admitting: Family

## 2018-05-20 MED ORDER — CARVEDILOL 6.25 MG PO TABS: 6.2500 mg | ORAL_TABLET | Freq: Two times a day (BID) | ORAL | 1 refills | Status: DC

## 2018-05-20 MED ORDER — AMLODIPINE BESYLATE 2.5 MG PO TABS: 2.5000 mg | ORAL_TABLET | Freq: Every day | ORAL | 1 refills | Status: DC

## 2018-05-20 MED ORDER — HYDRALAZINE HCL 25 MG PO TABS: 25.0000 mg | ORAL_TABLET | Freq: Four times a day (QID) | ORAL | 1 refills | Status: DC

## 2018-05-21 DIAGNOSIS — J3089 Other allergic rhinitis: Secondary | ICD-10-CM | POA: Diagnosis not present

## 2018-05-21 DIAGNOSIS — J301 Allergic rhinitis due to pollen: Secondary | ICD-10-CM | POA: Diagnosis not present

## 2018-05-21 DIAGNOSIS — J3081 Allergic rhinitis due to animal (cat) (dog) hair and dander: Secondary | ICD-10-CM | POA: Diagnosis not present

## 2018-05-29 DIAGNOSIS — J3089 Other allergic rhinitis: Secondary | ICD-10-CM | POA: Diagnosis not present

## 2018-05-29 DIAGNOSIS — J3081 Allergic rhinitis due to animal (cat) (dog) hair and dander: Secondary | ICD-10-CM | POA: Diagnosis not present

## 2018-05-29 DIAGNOSIS — J301 Allergic rhinitis due to pollen: Secondary | ICD-10-CM | POA: Diagnosis not present

## 2018-06-03 DIAGNOSIS — J301 Allergic rhinitis due to pollen: Secondary | ICD-10-CM | POA: Diagnosis not present

## 2018-06-03 DIAGNOSIS — J3089 Other allergic rhinitis: Secondary | ICD-10-CM | POA: Diagnosis not present

## 2018-06-03 DIAGNOSIS — J3081 Allergic rhinitis due to animal (cat) (dog) hair and dander: Secondary | ICD-10-CM | POA: Diagnosis not present

## 2018-06-06 ENCOUNTER — Telehealth: Payer: Self-pay | Admitting: Family

## 2018-06-06 NOTE — Telephone Encounter (Signed)
Copied from [place] [id number]. Topic: Quick Communication - See Telephone Encounter >> Jun 06, 2018  9:26 AM Margot Ables wrote: CRM for notification. See Telephone encounter for: 06/06/18. Pt is needing a letter on [place] to note that pt has been advised not to fly in the month of March due to the risk of [place]. Pt was supposed to be traveling to [place] and are cancelling the flight. Pt can be refunded if a doctors note is provided. Pt would like letter mailed or emailed. Pt said mail is fine.  Mail Address: [address]  [place] [id number]  EmaiL: [email or web address]

## 2018-06-11 DIAGNOSIS — J301 Allergic rhinitis due to pollen: Secondary | ICD-10-CM | POA: Diagnosis not present

## 2018-06-11 DIAGNOSIS — J3081 Allergic rhinitis due to animal (cat) (dog) hair and dander: Secondary | ICD-10-CM | POA: Diagnosis not present

## 2018-06-11 DIAGNOSIS — J3089 Other allergic rhinitis: Secondary | ICD-10-CM | POA: Diagnosis not present

## 2018-06-18 DIAGNOSIS — J301 Allergic rhinitis due to pollen: Secondary | ICD-10-CM | POA: Diagnosis not present

## 2018-06-18 DIAGNOSIS — J3081 Allergic rhinitis due to animal (cat) (dog) hair and dander: Secondary | ICD-10-CM | POA: Diagnosis not present

## 2018-06-18 DIAGNOSIS — J3089 Other allergic rhinitis: Secondary | ICD-10-CM | POA: Diagnosis not present

## 2018-07-01 DIAGNOSIS — J301 Allergic rhinitis due to pollen: Secondary | ICD-10-CM | POA: Diagnosis not present

## 2018-07-01 DIAGNOSIS — J3081 Allergic rhinitis due to animal (cat) (dog) hair and dander: Secondary | ICD-10-CM | POA: Diagnosis not present

## 2018-07-01 DIAGNOSIS — J3089 Other allergic rhinitis: Secondary | ICD-10-CM | POA: Diagnosis not present

## 2018-07-03 ENCOUNTER — Encounter: Payer: Self-pay | Admitting: Family

## 2018-07-08 DIAGNOSIS — J3089 Other allergic rhinitis: Secondary | ICD-10-CM | POA: Diagnosis not present

## 2018-07-08 DIAGNOSIS — J301 Allergic rhinitis due to pollen: Secondary | ICD-10-CM | POA: Diagnosis not present

## 2018-07-08 DIAGNOSIS — J3081 Allergic rhinitis due to animal (cat) (dog) hair and dander: Secondary | ICD-10-CM | POA: Diagnosis not present

## 2018-07-16 DIAGNOSIS — J301 Allergic rhinitis due to pollen: Secondary | ICD-10-CM | POA: Diagnosis not present

## 2018-07-16 DIAGNOSIS — J3089 Other allergic rhinitis: Secondary | ICD-10-CM | POA: Diagnosis not present

## 2018-07-16 DIAGNOSIS — J3081 Allergic rhinitis due to animal (cat) (dog) hair and dander: Secondary | ICD-10-CM | POA: Diagnosis not present

## 2018-07-26 DIAGNOSIS — J301 Allergic rhinitis due to pollen: Secondary | ICD-10-CM | POA: Diagnosis not present

## 2018-07-26 DIAGNOSIS — J3081 Allergic rhinitis due to animal (cat) (dog) hair and dander: Secondary | ICD-10-CM | POA: Diagnosis not present

## 2018-07-26 DIAGNOSIS — J3089 Other allergic rhinitis: Secondary | ICD-10-CM | POA: Diagnosis not present

## 2018-08-01 DIAGNOSIS — J301 Allergic rhinitis due to pollen: Secondary | ICD-10-CM | POA: Diagnosis not present

## 2018-08-01 DIAGNOSIS — J3081 Allergic rhinitis due to animal (cat) (dog) hair and dander: Secondary | ICD-10-CM | POA: Diagnosis not present

## 2018-08-01 DIAGNOSIS — J3089 Other allergic rhinitis: Secondary | ICD-10-CM | POA: Diagnosis not present

## 2018-08-08 DIAGNOSIS — J301 Allergic rhinitis due to pollen: Secondary | ICD-10-CM | POA: Diagnosis not present

## 2018-08-08 DIAGNOSIS — J3089 Other allergic rhinitis: Secondary | ICD-10-CM | POA: Diagnosis not present

## 2018-08-08 DIAGNOSIS — J3081 Allergic rhinitis due to animal (cat) (dog) hair and dander: Secondary | ICD-10-CM | POA: Diagnosis not present

## 2018-08-14 ENCOUNTER — Encounter: Payer: Self-pay | Admitting: Family

## 2018-08-14 MED ORDER — AMLODIPINE BESYLATE 2.5 MG PO TABS: 2.5000 mg | ORAL_TABLET | Freq: Every day | ORAL | 1 refills | Status: DC

## 2018-08-14 MED ORDER — CARVEDILOL 6.25 MG PO TABS: 6.2500 mg | ORAL_TABLET | Freq: Two times a day (BID) | ORAL | 1 refills | Status: DC

## 2018-08-14 MED ORDER — HYDRALAZINE HCL 25 MG PO TABS: 25.0000 mg | ORAL_TABLET | Freq: Four times a day (QID) | ORAL | 1 refills | Status: DC

## 2018-08-14 NOTE — Telephone Encounter (Signed)
See mychart.  

## 2018-08-15 DIAGNOSIS — J3081 Allergic rhinitis due to animal (cat) (dog) hair and dander: Secondary | ICD-10-CM | POA: Diagnosis not present

## 2018-08-15 DIAGNOSIS — J3089 Other allergic rhinitis: Secondary | ICD-10-CM | POA: Diagnosis not present

## 2018-08-15 DIAGNOSIS — J301 Allergic rhinitis due to pollen: Secondary | ICD-10-CM | POA: Diagnosis not present

## 2018-08-21 DIAGNOSIS — J301 Allergic rhinitis due to pollen: Secondary | ICD-10-CM | POA: Diagnosis not present

## 2018-08-21 DIAGNOSIS — J3081 Allergic rhinitis due to animal (cat) (dog) hair and dander: Secondary | ICD-10-CM | POA: Diagnosis not present

## 2018-08-21 DIAGNOSIS — J3089 Other allergic rhinitis: Secondary | ICD-10-CM | POA: Diagnosis not present

## 2018-09-03 DIAGNOSIS — J3089 Other allergic rhinitis: Secondary | ICD-10-CM | POA: Diagnosis not present

## 2018-09-03 DIAGNOSIS — J301 Allergic rhinitis due to pollen: Secondary | ICD-10-CM | POA: Diagnosis not present

## 2018-09-03 DIAGNOSIS — J3081 Allergic rhinitis due to animal (cat) (dog) hair and dander: Secondary | ICD-10-CM | POA: Diagnosis not present

## 2018-09-13 DIAGNOSIS — J301 Allergic rhinitis due to pollen: Secondary | ICD-10-CM | POA: Diagnosis not present

## 2018-09-13 DIAGNOSIS — J3089 Other allergic rhinitis: Secondary | ICD-10-CM | POA: Diagnosis not present

## 2018-09-18 DIAGNOSIS — J301 Allergic rhinitis due to pollen: Secondary | ICD-10-CM | POA: Diagnosis not present

## 2018-09-18 DIAGNOSIS — J3089 Other allergic rhinitis: Secondary | ICD-10-CM | POA: Diagnosis not present

## 2018-09-18 DIAGNOSIS — J3081 Allergic rhinitis due to animal (cat) (dog) hair and dander: Secondary | ICD-10-CM | POA: Diagnosis not present

## 2018-09-23 DIAGNOSIS — J3089 Other allergic rhinitis: Secondary | ICD-10-CM | POA: Diagnosis not present

## 2018-09-23 DIAGNOSIS — J301 Allergic rhinitis due to pollen: Secondary | ICD-10-CM | POA: Diagnosis not present

## 2018-09-23 DIAGNOSIS — J3081 Allergic rhinitis due to animal (cat) (dog) hair and dander: Secondary | ICD-10-CM | POA: Diagnosis not present

## 2018-09-26 DIAGNOSIS — J301 Allergic rhinitis due to pollen: Secondary | ICD-10-CM | POA: Diagnosis not present

## 2018-09-26 DIAGNOSIS — J3089 Other allergic rhinitis: Secondary | ICD-10-CM | POA: Diagnosis not present

## 2018-10-03 DIAGNOSIS — J301 Allergic rhinitis due to pollen: Secondary | ICD-10-CM | POA: Diagnosis not present

## 2018-10-03 DIAGNOSIS — J3089 Other allergic rhinitis: Secondary | ICD-10-CM | POA: Diagnosis not present

## 2018-10-10 DIAGNOSIS — J301 Allergic rhinitis due to pollen: Secondary | ICD-10-CM | POA: Diagnosis not present

## 2018-10-10 DIAGNOSIS — J3081 Allergic rhinitis due to animal (cat) (dog) hair and dander: Secondary | ICD-10-CM | POA: Diagnosis not present

## 2018-10-10 DIAGNOSIS — J3089 Other allergic rhinitis: Secondary | ICD-10-CM | POA: Diagnosis not present

## 2018-10-18 DIAGNOSIS — J3081 Allergic rhinitis due to animal (cat) (dog) hair and dander: Secondary | ICD-10-CM | POA: Diagnosis not present

## 2018-10-18 DIAGNOSIS — J301 Allergic rhinitis due to pollen: Secondary | ICD-10-CM | POA: Diagnosis not present

## 2018-10-25 DIAGNOSIS — J3081 Allergic rhinitis due to animal (cat) (dog) hair and dander: Secondary | ICD-10-CM | POA: Diagnosis not present

## 2018-10-25 DIAGNOSIS — J3089 Other allergic rhinitis: Secondary | ICD-10-CM | POA: Diagnosis not present

## 2018-10-25 DIAGNOSIS — J301 Allergic rhinitis due to pollen: Secondary | ICD-10-CM | POA: Diagnosis not present

## 2018-10-30 DIAGNOSIS — J3089 Other allergic rhinitis: Secondary | ICD-10-CM | POA: Diagnosis not present

## 2018-10-30 DIAGNOSIS — J301 Allergic rhinitis due to pollen: Secondary | ICD-10-CM | POA: Diagnosis not present

## 2018-10-30 DIAGNOSIS — J3081 Allergic rhinitis due to animal (cat) (dog) hair and dander: Secondary | ICD-10-CM | POA: Diagnosis not present

## 2018-11-05 ENCOUNTER — Encounter: Payer: Self-pay | Admitting: Family

## 2018-11-05 MED ORDER — AMLODIPINE BESYLATE 2.5 MG PO TABS: 2.5000 mg | ORAL_TABLET | Freq: Every day | ORAL | 1 refills | Status: DC

## 2018-11-05 MED ORDER — HYDRALAZINE HCL 25 MG PO TABS: 25.0000 mg | ORAL_TABLET | Freq: Four times a day (QID) | ORAL | 1 refills | Status: DC

## 2018-11-05 MED ORDER — CARVEDILOL 6.25 MG PO TABS: 6.2500 mg | ORAL_TABLET | Freq: Two times a day (BID) | ORAL | 1 refills | Status: DC

## 2018-11-08 DIAGNOSIS — J3081 Allergic rhinitis due to animal (cat) (dog) hair and dander: Secondary | ICD-10-CM | POA: Diagnosis not present

## 2018-11-08 DIAGNOSIS — J3089 Other allergic rhinitis: Secondary | ICD-10-CM | POA: Diagnosis not present

## 2018-11-08 DIAGNOSIS — J301 Allergic rhinitis due to pollen: Secondary | ICD-10-CM | POA: Diagnosis not present

## 2018-11-14 DIAGNOSIS — J3081 Allergic rhinitis due to animal (cat) (dog) hair and dander: Secondary | ICD-10-CM | POA: Diagnosis not present

## 2018-11-14 DIAGNOSIS — J301 Allergic rhinitis due to pollen: Secondary | ICD-10-CM | POA: Diagnosis not present

## 2018-11-14 DIAGNOSIS — J3089 Other allergic rhinitis: Secondary | ICD-10-CM | POA: Diagnosis not present

## 2018-11-21 DIAGNOSIS — J3081 Allergic rhinitis due to animal (cat) (dog) hair and dander: Secondary | ICD-10-CM | POA: Diagnosis not present

## 2018-11-21 DIAGNOSIS — J3089 Other allergic rhinitis: Secondary | ICD-10-CM | POA: Diagnosis not present

## 2018-11-21 DIAGNOSIS — J301 Allergic rhinitis due to pollen: Secondary | ICD-10-CM | POA: Diagnosis not present

## 2018-11-28 DIAGNOSIS — J3081 Allergic rhinitis due to animal (cat) (dog) hair and dander: Secondary | ICD-10-CM | POA: Diagnosis not present

## 2018-11-28 DIAGNOSIS — J301 Allergic rhinitis due to pollen: Secondary | ICD-10-CM | POA: Diagnosis not present

## 2018-11-28 DIAGNOSIS — J3089 Other allergic rhinitis: Secondary | ICD-10-CM | POA: Diagnosis not present

## 2018-12-06 DIAGNOSIS — J3081 Allergic rhinitis due to animal (cat) (dog) hair and dander: Secondary | ICD-10-CM | POA: Diagnosis not present

## 2018-12-06 DIAGNOSIS — J3089 Other allergic rhinitis: Secondary | ICD-10-CM | POA: Diagnosis not present

## 2018-12-06 DIAGNOSIS — J301 Allergic rhinitis due to pollen: Secondary | ICD-10-CM | POA: Diagnosis not present

## 2018-12-19 DIAGNOSIS — J301 Allergic rhinitis due to pollen: Secondary | ICD-10-CM | POA: Diagnosis not present

## 2018-12-19 DIAGNOSIS — J3089 Other allergic rhinitis: Secondary | ICD-10-CM | POA: Diagnosis not present

## 2018-12-19 DIAGNOSIS — J3081 Allergic rhinitis due to animal (cat) (dog) hair and dander: Secondary | ICD-10-CM | POA: Diagnosis not present

## 2018-12-27 DIAGNOSIS — J3081 Allergic rhinitis due to animal (cat) (dog) hair and dander: Secondary | ICD-10-CM | POA: Diagnosis not present

## 2018-12-27 DIAGNOSIS — J3089 Other allergic rhinitis: Secondary | ICD-10-CM | POA: Diagnosis not present

## 2018-12-27 DIAGNOSIS — J301 Allergic rhinitis due to pollen: Secondary | ICD-10-CM | POA: Diagnosis not present

## 2019-01-02 DIAGNOSIS — J301 Allergic rhinitis due to pollen: Secondary | ICD-10-CM | POA: Diagnosis not present

## 2019-01-02 DIAGNOSIS — J3089 Other allergic rhinitis: Secondary | ICD-10-CM | POA: Diagnosis not present

## 2019-01-02 DIAGNOSIS — J3081 Allergic rhinitis due to animal (cat) (dog) hair and dander: Secondary | ICD-10-CM | POA: Diagnosis not present

## 2019-01-05 ENCOUNTER — Encounter: Payer: Self-pay | Admitting: Family

## 2019-01-06 ENCOUNTER — Ambulatory Visit (INDEPENDENT_AMBULATORY_CARE_PROVIDER_SITE_OTHER): Payer: PPO | Admitting: Family

## 2019-01-06 DIAGNOSIS — J309 Allergic rhinitis, unspecified: Secondary | ICD-10-CM | POA: Diagnosis not present

## 2019-01-06 DIAGNOSIS — J019 Acute sinusitis, unspecified: Secondary | ICD-10-CM | POA: Diagnosis not present

## 2019-01-06 MED ORDER — CEFUROXIME AXETIL 500 MG PO TABS: 500.0000 mg | ORAL_TABLET | Freq: Two times a day (BID) | ORAL | 0 refills | Status: DC

## 2019-01-06 MED ORDER — MONTELUKAST SODIUM 10 MG PO TABS: 10.0000 mg | ORAL_TABLET | Freq: Every day | ORAL | 5 refills | Status: DC

## 2019-01-06 NOTE — Telephone Encounter (Signed)
Please call pt to schedule a virtual visit today.

## 2019-01-06 NOTE — Telephone Encounter (Signed)
Patient was scheduled for virtual visit today 

## 2019-01-06 NOTE — Progress Notes (Signed)
Virtual Visit via Video Note  I connected with Gregory Brennan on 01/06/19 at 10:40 AM EDT by a video enabled telemedicine application and verified that I am speaking with the correct person using two identifiers.  Location: Patient: home Provider: home   I discussed the limitations of evaluation and management by telemedicine and the availability of in person appointments. The patient expressed understanding and agreed to proceed.  History of Present Illness:  Patient is a 73 yr old male who presents today to discuss sinus congestion.  He reports sinus congestion for 3-4 weeks.  Eyes have been irritated and not improving with alaway.  Reports that he is now having some puffiness around his eyes and upper cheeks.  Denies associated fever.     Current Regime: Nasal rinse first thing a.m., mid-day, evening Allegra each a.m. (switched from Zyrtec) Guaifenesin 600mg  twice daily Fluticasone once a.m. Alaway 2-3 times per day Ibuprofen twice daily  Reports some tenderness.  Nasal drainage is brownish in color.    Past Medical History:  Diagnosis Date  . Allergy   . Heart murmur   . Hypertension   . MRSA (methicillin resistant staph aureus) culture positive 2007  . Rheumatic fever    age 25     Social History   Socioeconomic History  . Marital status: Married    Spouse name: Not on file  . Number of children: Not on file  . Years of education: Not on file  . Highest education level: Not on file  Occupational History  . Not on file  Social Needs  . Financial resource strain: Not on file  . Food insecurity    Worry: Not on file    Inability: Not on file  . Transportation needs    Medical: Not on file    Non-medical: Not on file  Tobacco Use  . Smoking status: Never Smoker  . Smokeless tobacco: Never Used  Substance and Sexual Activity  . Alcohol use: No  . Drug use: No  . Sexual activity: Yes  Lifestyle  . Physical activity    Days per week: Not on file    Minutes  per session: Not on file  . Stress: Not on file  Relationships  . Social Herbalist on phone: Not on file    Gets together: Not on file    Attends religious service: Not on file    Active member of club or organization: Not on file    Attends meetings of clubs or organizations: Not on file    Relationship status: Not on file  . Intimate partner violence    Fear of current or ex partner: Not on file    Emotionally abused: Not on file    Physically abused: Not on file    Forced sexual activity: Not on file  Other Topics Concern  . 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 ABDOMINAL HERNIA REPAIR  12/31/2011    Family History  Problem Relation Age of Onset  . Emphysema Mother   . Hypertension Father   . Colon cancer Neg Hx   . Colon polyps  Neg Hx   . Esophageal cancer Neg Hx   . Rectal cancer Neg Hx   . Stomach cancer Neg Hx     Allergies  Allergen Reactions  . Hctz [Hydrochlorothiazide]     Hyponatremia   . Losartan     Hyperkalemia   . Penicillins Rash    Current Outpatient Medications on File Prior to Visit  Medication Sig Dispense Refill  . amLODipine (NORVASC) 2.5 MG tablet Take 1 tablet (2.5 mg total) by mouth daily. 90 tablet 1  . carvedilol (COREG) 6.25 MG tablet Take 1 tablet (6.25 mg total) by mouth 2 (two) times daily with a meal. 180 tablet 1  . cetirizine (ZYRTEC) 10 MG tablet Take 10 mg by mouth daily.    . fluticasone (FLONASE) 50 MCG/ACT nasal spray Place 1 spray into both nostrils daily as needed.     . hydrALAZINE (APRESOLINE) 25 MG tablet Take 1 tablet (25 mg total) by mouth 4 (four) times daily. 360 tablet 1   No current facility-administered medications  on file prior to visit.     There were no vitals taken for this visit.   Observations/Objective:   Gen: Awake, alert, no acute distress ENT: slight swelling around eyes, pt reports + discomfort to palpation of bilateral cheeks/forehead Resp: Breathing is even and non-labored Psych: calm/pleasant demeanor Neuro: Alert and Oriented x 3, + facial symmetry, speech is clear.   Assessment and Plan:  Allergic rhinitis- uncontrolled. Advised pt to add singulair once daily. Continue flonase, allegra.  Sinusitis- new. Rx with ceftin. Pt is advised to call if symptoms worsen or if not improved in 1 week.  He asked me about going to the [place].  I advised him that it is safer to work out at home and outside due to COVID-19 pandemic.   Follow Up Instructions:    I discussed the assessment and treatment plan with the patient. The patient was provided an opportunity to ask questions and all were answered. The patient agreed with the plan and demonstrated an understanding of the instructions.   The patient was advised to call back or seek an in-person evaluation if the symptoms worsen or if the condition fails to improve as anticipated.  Nance Pear, NP

## 2019-01-09 DIAGNOSIS — J301 Allergic rhinitis due to pollen: Secondary | ICD-10-CM | POA: Diagnosis not present

## 2019-01-09 DIAGNOSIS — J3089 Other allergic rhinitis: Secondary | ICD-10-CM | POA: Diagnosis not present

## 2019-01-09 DIAGNOSIS — J3081 Allergic rhinitis due to animal (cat) (dog) hair and dander: Secondary | ICD-10-CM | POA: Diagnosis not present

## 2019-01-22 ENCOUNTER — Encounter: Payer: Self-pay | Admitting: Family

## 2019-01-22 DIAGNOSIS — J301 Allergic rhinitis due to pollen: Secondary | ICD-10-CM | POA: Diagnosis not present

## 2019-01-22 DIAGNOSIS — J3081 Allergic rhinitis due to animal (cat) (dog) hair and dander: Secondary | ICD-10-CM | POA: Diagnosis not present

## 2019-01-22 DIAGNOSIS — J3089 Other allergic rhinitis: Secondary | ICD-10-CM | POA: Diagnosis not present

## 2019-01-23 MED ORDER — AZELASTINE HCL 0.1 % NA SOLN: 2.0000 | Freq: Two times a day (BID) | NASAL | 5 refills | Status: DC

## 2019-01-23 MED ORDER — OLOPATADINE HCL 0.1 % OP SOLN: 1.0000 [drp] | Freq: Two times a day (BID) | OPHTHALMIC | 5 refills | Status: DC

## 2019-01-31 DIAGNOSIS — J3089 Other allergic rhinitis: Secondary | ICD-10-CM | POA: Diagnosis not present

## 2019-01-31 DIAGNOSIS — J301 Allergic rhinitis due to pollen: Secondary | ICD-10-CM | POA: Diagnosis not present

## 2019-01-31 DIAGNOSIS — J3081 Allergic rhinitis due to animal (cat) (dog) hair and dander: Secondary | ICD-10-CM | POA: Diagnosis not present

## 2019-02-05 DIAGNOSIS — J301 Allergic rhinitis due to pollen: Secondary | ICD-10-CM | POA: Diagnosis not present

## 2019-02-05 DIAGNOSIS — J3089 Other allergic rhinitis: Secondary | ICD-10-CM | POA: Diagnosis not present

## 2019-02-05 DIAGNOSIS — J3081 Allergic rhinitis due to animal (cat) (dog) hair and dander: Secondary | ICD-10-CM | POA: Diagnosis not present

## 2019-02-11 ENCOUNTER — Other Ambulatory Visit: Payer: Self-pay

## 2019-02-12 DIAGNOSIS — J3081 Allergic rhinitis due to animal (cat) (dog) hair and dander: Secondary | ICD-10-CM | POA: Diagnosis not present

## 2019-02-12 DIAGNOSIS — J301 Allergic rhinitis due to pollen: Secondary | ICD-10-CM | POA: Diagnosis not present

## 2019-02-12 DIAGNOSIS — J3089 Other allergic rhinitis: Secondary | ICD-10-CM | POA: Diagnosis not present

## 2019-02-21 DIAGNOSIS — J3089 Other allergic rhinitis: Secondary | ICD-10-CM | POA: Diagnosis not present

## 2019-02-21 DIAGNOSIS — J3081 Allergic rhinitis due to animal (cat) (dog) hair and dander: Secondary | ICD-10-CM | POA: Diagnosis not present

## 2019-02-21 DIAGNOSIS — J301 Allergic rhinitis due to pollen: Secondary | ICD-10-CM | POA: Diagnosis not present

## 2019-02-28 DIAGNOSIS — J3081 Allergic rhinitis due to animal (cat) (dog) hair and dander: Secondary | ICD-10-CM | POA: Diagnosis not present

## 2019-02-28 DIAGNOSIS — J301 Allergic rhinitis due to pollen: Secondary | ICD-10-CM | POA: Diagnosis not present

## 2019-02-28 DIAGNOSIS — J3089 Other allergic rhinitis: Secondary | ICD-10-CM | POA: Diagnosis not present

## 2019-03-11 DIAGNOSIS — J301 Allergic rhinitis due to pollen: Secondary | ICD-10-CM | POA: Diagnosis not present

## 2019-03-11 DIAGNOSIS — J3081 Allergic rhinitis due to animal (cat) (dog) hair and dander: Secondary | ICD-10-CM | POA: Diagnosis not present

## 2019-03-11 DIAGNOSIS — J3089 Other allergic rhinitis: Secondary | ICD-10-CM | POA: Diagnosis not present

## 2019-03-20 DIAGNOSIS — J301 Allergic rhinitis due to pollen: Secondary | ICD-10-CM | POA: Diagnosis not present

## 2019-03-20 DIAGNOSIS — J3081 Allergic rhinitis due to animal (cat) (dog) hair and dander: Secondary | ICD-10-CM | POA: Diagnosis not present

## 2019-03-20 DIAGNOSIS — J3089 Other allergic rhinitis: Secondary | ICD-10-CM | POA: Diagnosis not present

## 2019-03-21 HISTORY — PX: MOHS SURGERY: SUR867

## 2019-03-28 DIAGNOSIS — J3089 Other allergic rhinitis: Secondary | ICD-10-CM | POA: Diagnosis not present

## 2019-03-28 DIAGNOSIS — J3081 Allergic rhinitis due to animal (cat) (dog) hair and dander: Secondary | ICD-10-CM | POA: Diagnosis not present

## 2019-03-28 DIAGNOSIS — J301 Allergic rhinitis due to pollen: Secondary | ICD-10-CM | POA: Diagnosis not present

## 2019-04-02 DIAGNOSIS — J3089 Other allergic rhinitis: Secondary | ICD-10-CM | POA: Diagnosis not present

## 2019-04-02 DIAGNOSIS — J3081 Allergic rhinitis due to animal (cat) (dog) hair and dander: Secondary | ICD-10-CM | POA: Diagnosis not present

## 2019-04-02 DIAGNOSIS — J301 Allergic rhinitis due to pollen: Secondary | ICD-10-CM | POA: Diagnosis not present

## 2019-04-11 DIAGNOSIS — J3081 Allergic rhinitis due to animal (cat) (dog) hair and dander: Secondary | ICD-10-CM | POA: Diagnosis not present

## 2019-04-11 DIAGNOSIS — J301 Allergic rhinitis due to pollen: Secondary | ICD-10-CM | POA: Diagnosis not present

## 2019-04-11 DIAGNOSIS — H1045 Other chronic allergic conjunctivitis: Secondary | ICD-10-CM | POA: Diagnosis not present

## 2019-04-11 DIAGNOSIS — J3089 Other allergic rhinitis: Secondary | ICD-10-CM | POA: Diagnosis not present

## 2019-04-17 DIAGNOSIS — J3081 Allergic rhinitis due to animal (cat) (dog) hair and dander: Secondary | ICD-10-CM | POA: Diagnosis not present

## 2019-04-17 DIAGNOSIS — J301 Allergic rhinitis due to pollen: Secondary | ICD-10-CM | POA: Diagnosis not present

## 2019-04-17 DIAGNOSIS — J3089 Other allergic rhinitis: Secondary | ICD-10-CM | POA: Diagnosis not present

## 2019-04-24 DIAGNOSIS — J3089 Other allergic rhinitis: Secondary | ICD-10-CM | POA: Diagnosis not present

## 2019-04-24 DIAGNOSIS — J301 Allergic rhinitis due to pollen: Secondary | ICD-10-CM | POA: Diagnosis not present

## 2019-04-24 DIAGNOSIS — J3081 Allergic rhinitis due to animal (cat) (dog) hair and dander: Secondary | ICD-10-CM | POA: Diagnosis not present

## 2019-04-30 ENCOUNTER — Other Ambulatory Visit: Payer: Self-pay | Admitting: Family

## 2019-04-30 DIAGNOSIS — J301 Allergic rhinitis due to pollen: Secondary | ICD-10-CM | POA: Diagnosis not present

## 2019-04-30 DIAGNOSIS — J3089 Other allergic rhinitis: Secondary | ICD-10-CM | POA: Diagnosis not present

## 2019-04-30 DIAGNOSIS — J3081 Allergic rhinitis due to animal (cat) (dog) hair and dander: Secondary | ICD-10-CM | POA: Diagnosis not present

## 2019-05-02 ENCOUNTER — Other Ambulatory Visit: Payer: Self-pay | Admitting: Family

## 2019-05-07 DIAGNOSIS — J3089 Other allergic rhinitis: Secondary | ICD-10-CM | POA: Diagnosis not present

## 2019-05-07 DIAGNOSIS — J3081 Allergic rhinitis due to animal (cat) (dog) hair and dander: Secondary | ICD-10-CM | POA: Diagnosis not present

## 2019-05-07 DIAGNOSIS — J301 Allergic rhinitis due to pollen: Secondary | ICD-10-CM | POA: Diagnosis not present

## 2019-05-14 ENCOUNTER — Ambulatory Visit: Payer: PPO | Admitting: Family

## 2019-05-15 DIAGNOSIS — J3089 Other allergic rhinitis: Secondary | ICD-10-CM | POA: Diagnosis not present

## 2019-05-15 DIAGNOSIS — J3081 Allergic rhinitis due to animal (cat) (dog) hair and dander: Secondary | ICD-10-CM | POA: Diagnosis not present

## 2019-05-15 DIAGNOSIS — J301 Allergic rhinitis due to pollen: Secondary | ICD-10-CM | POA: Diagnosis not present

## 2019-05-19 ENCOUNTER — Encounter: Payer: Self-pay | Admitting: Family

## 2019-05-23 ENCOUNTER — Other Ambulatory Visit: Payer: Self-pay

## 2019-05-23 ENCOUNTER — Ambulatory Visit (INDEPENDENT_AMBULATORY_CARE_PROVIDER_SITE_OTHER): Payer: PPO | Admitting: Family

## 2019-05-23 ENCOUNTER — Encounter: Payer: Self-pay | Admitting: Family

## 2019-05-23 VITALS — BP 158/75 | HR 50 | Temp 97.0°F | Resp 16 | Ht 71.0 in | Wt 166.0 lb

## 2019-05-23 DIAGNOSIS — I10 Essential (primary) hypertension: Secondary | ICD-10-CM

## 2019-05-23 DIAGNOSIS — E871 Hypo-osmolality and hyponatremia: Secondary | ICD-10-CM | POA: Diagnosis not present

## 2019-05-23 DIAGNOSIS — J309 Allergic rhinitis, unspecified: Secondary | ICD-10-CM | POA: Diagnosis not present

## 2019-05-23 DIAGNOSIS — J301 Allergic rhinitis due to pollen: Secondary | ICD-10-CM | POA: Diagnosis not present

## 2019-05-23 DIAGNOSIS — H1013 Acute atopic conjunctivitis, bilateral: Secondary | ICD-10-CM

## 2019-05-23 DIAGNOSIS — J3081 Allergic rhinitis due to animal (cat) (dog) hair and dander: Secondary | ICD-10-CM | POA: Diagnosis not present

## 2019-05-23 DIAGNOSIS — J3089 Other allergic rhinitis: Secondary | ICD-10-CM | POA: Diagnosis not present

## 2019-05-23 LAB — BASIC METABOLIC PANEL
BUN: 10 mg/dL (ref 6–23)
CO2: 31 mEq/L (ref 19–32)
Calcium: 9.3 mg/dL (ref 8.4–10.5)
Chloride: 98 mEq/L (ref 96–112)
Creatinine, Ser: 0.86 mg/dL (ref 0.40–1.50)
GFR: 87.43 mL/min (ref >60.00)
Glucose, Bld: 101 mg/dL — ABNORMAL HIGH (ref 70–99)
Potassium: 4.6 mEq/L (ref 3.5–5.1)
Sodium: 134 mEq/L — ABNORMAL LOW (ref 135–145)

## 2019-05-23 NOTE — Progress Notes (Signed)
Subjective:    Patient ID: Gregory Brennan, male    DOB: 03/11/1946, 74 y.o.   MRN: [id number]  HPI  Patient is a 74 yr old male who presents today for follow up.  HTN- maintained on coreg, hydralazine and amlodipine. Reports that he checked bp this AM 127/61. BP Readings from Last 3 Encounters:  05/23/19 (!) 158/75  03/08/18 128/66  12/21/17 135/77   Wt Readings from Last 3 Encounters:  05/23/19 166 lb (75.3 kg)  03/08/18 166 lb (75.3 kg)  12/21/17 168 lb 6.4 oz (76.4 kg)    Allergic Rhinitis- maintained on astelin, flonase, zyrtec.  Also uses patanol prn for his eyes.   Allergic conjunctivitis. Reports a "huge difference" with allergy eye drops.    Hx Hyponatremia- due for follow up lab work today.   He and his wife have completed the COVID-19 vaccine series.  Review of Systems    see HPI  Past Medical History:  Diagnosis Date  . Allergy   . Heart murmur   . Hypertension   . MRSA (methicillin resistant staph aureus) culture positive 2007  . Rheumatic fever    age 76     Social History   Socioeconomic History  . Marital status: Married    Spouse name: Not on file  . Number of children: Not on file  . Years of education: Not on file  . Highest education level: Not on file  Occupational History  . Not on file  Tobacco Use  . Smoking status: Never Smoker  . Smokeless tobacco: Never Used  Substance and Sexual Activity  . Alcohol use: No  . Drug use: No  . Sexual activity: Yes  Other Topics Concern  . 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 Difficulty of Paying Living Expenses: Not on file  Food Insecurity:   . Worried About Charity fundraiser in the Last Year:  Not on file  . Ran Out of Food in the Last Year: Not on file  Transportation Needs:   . Lack of Transportation (Medical): Not on file  . Lack of Transportation (Non-Medical): Not on file  Physical Activity:   . Days of Exercise per Week: Not on file  . Minutes of Exercise per Session: Not on file  Stress:   . Feeling of Stress : Not on file  Social Connections:   . Frequency of Communication with Friends and Family: Not on file  . Frequency of Social Gatherings with Friends and Family: Not on file  . Attends Religious Services: Not on file  . Active Member of Clubs or Organizations: Not on file  . Attends Archivist Meetings: Not on file  . Marital Status: Not on file  Intimate Partner Violence:   . Fear of Current or Ex-Partner: Not on file  . Emotionally Abused: Not on file  . Physically Abused: Not on file  . Sexually Abused: Not on file    Past Surgical History:  Procedure Laterality Date  . ABDOMINAL HERNIA REPAIR  12/31/2011    Family History  Problem Relation Age of Onset  . Emphysema Mother   .  Hypertension Father   . Colon cancer Neg Hx   . Colon polyps Neg Hx   . Esophageal cancer Neg Hx   . Rectal cancer Neg Hx   . Stomach cancer Neg Hx     Allergies  Allergen Reactions  . Hctz [Hydrochlorothiazide]     Hyponatremia   . Losartan     Hyperkalemia   . Penicillins Rash    Current Outpatient Medications on File Prior to Visit  Medication Sig Dispense Refill  . amLODipine (NORVASC) 2.5 MG tablet TAKE 1 TABLET BY MOUTH EVERY DAY 90 tablet 1  . azelastine (ASTELIN) 0.1 % nasal spray Place 2 sprays into both nostrils 2 (two) times daily. Use in each nostril as directed 30 mL 5  . carvedilol (COREG) 6.25 MG tablet TAKE 1 TABLET (6.25 MG TOTAL) BY MOUTH 2 (TWO) TIMES DAILY WITH A MEAL. 180 tablet 1  . cetirizine (ZYRTEC) 10 MG tablet Take 10 mg by mouth daily.    . fluticasone (FLONASE) 50 MCG/ACT nasal spray Place 1 spray into both nostrils daily  as needed.     . hydrALAZINE (APRESOLINE) 25 MG tablet TAKE 1 TABLET (25 MG TOTAL) BY MOUTH 4 (FOUR) TIMES DAILY. 360 tablet 0  . montelukast (SINGULAIR) 10 MG tablet Take 1 tablet (10 mg total) by mouth at bedtime. 30 tablet 5  . olopatadine (PATANOL) 0.1 % ophthalmic solution Place 1 drop into both eyes 2 (two) times daily. 5 mL 5   No current facility-administered medications on file prior to visit.    BP (!) 158/75 (BP Location: Right Arm, Patient Position: Sitting, Cuff Size: Small)   Pulse (!) 50   Temp (!) 97 F (36.1 C) (Temporal)   Resp 16   Ht 5\' 11"  (1.803 m)   Wt 166 lb (75.3 kg)   SpO2 100%   BMI 23.15 kg/m    Objective:   Physical Exam Constitutional:      General: He is not in acute distress.    Appearance: He is well-developed.  HENT:     Head: Normocephalic and atraumatic.  Cardiovascular:     Rate and Rhythm: Normal rate and regular rhythm.     Heart sounds: No murmur.  Pulmonary:     Effort: Pulmonary effort is normal. No respiratory distress.     Breath sounds: Normal breath sounds. No wheezing or rales.  Skin:    General: Skin is warm and dry.  Neurological:     Mental Status: He is alert and oriented to person, place, and time.  Psychiatric:        Behavior: Behavior normal.        Thought Content: Thought content normal.           Assessment & Plan:  Hypertension-blood pressure is elevated today.  I have asked the patient to check his blood pressure once daily at home for 1 week and to send me his readings via MyChart for review.  Continue current regimen for now.  History of hyponatremia-will obtain follow-up basic metabolic panel.  Allergic rhinitis-stable on current regimen.  Continue same.  Allergic conjunctivitis-much improved with addition of Patanol.  Continue same.  This visit occurred during the SARS-CoV-2 public health emergency.  Safety protocols were in place, including screening questions prior to the visit, additional usage  of staff PPE, and extensive cleaning of exam room while observing appropriate contact time as indicated for disinfecting solutions.

## 2019-05-23 NOTE — Patient Instructions (Addendum)
Please complete lab work prior to leaving. °Check blood pressure once daily for 1 week and send me your readings via mychart.  °

## 2019-05-30 DIAGNOSIS — J3081 Allergic rhinitis due to animal (cat) (dog) hair and dander: Secondary | ICD-10-CM | POA: Diagnosis not present

## 2019-05-30 DIAGNOSIS — J3089 Other allergic rhinitis: Secondary | ICD-10-CM | POA: Diagnosis not present

## 2019-05-30 DIAGNOSIS — J301 Allergic rhinitis due to pollen: Secondary | ICD-10-CM | POA: Diagnosis not present

## 2019-06-05 DIAGNOSIS — J3089 Other allergic rhinitis: Secondary | ICD-10-CM | POA: Diagnosis not present

## 2019-06-05 DIAGNOSIS — J301 Allergic rhinitis due to pollen: Secondary | ICD-10-CM | POA: Diagnosis not present

## 2019-06-05 DIAGNOSIS — J3081 Allergic rhinitis due to animal (cat) (dog) hair and dander: Secondary | ICD-10-CM | POA: Diagnosis not present

## 2019-06-08 ENCOUNTER — Encounter: Payer: Self-pay | Admitting: Family

## 2019-06-11 DIAGNOSIS — J3081 Allergic rhinitis due to animal (cat) (dog) hair and dander: Secondary | ICD-10-CM | POA: Diagnosis not present

## 2019-06-11 DIAGNOSIS — J301 Allergic rhinitis due to pollen: Secondary | ICD-10-CM | POA: Diagnosis not present

## 2019-06-11 DIAGNOSIS — J3089 Other allergic rhinitis: Secondary | ICD-10-CM | POA: Diagnosis not present

## 2019-06-13 ENCOUNTER — Ambulatory Visit (INDEPENDENT_AMBULATORY_CARE_PROVIDER_SITE_OTHER): Payer: PPO

## 2019-06-13 ENCOUNTER — Telehealth: Payer: Self-pay | Admitting: Family

## 2019-06-13 ENCOUNTER — Ambulatory Visit: Payer: PPO | Admitting: Family

## 2019-06-13 ENCOUNTER — Other Ambulatory Visit: Payer: Self-pay

## 2019-06-13 DIAGNOSIS — Z23 Encounter for immunization: Secondary | ICD-10-CM

## 2019-06-13 MED ORDER — TETANUS-DIPHTHERIA TOXOIDS TD 5-2 LFU IM INJ: 0.5000 mL | INJECTION | Freq: Once | INTRAMUSCULAR | 0 refills | Status: AC

## 2019-06-13 NOTE — Telephone Encounter (Signed)
Patient presents for nurse visit to begin twinrix accelerated series. Also wants a tetanus. Tetanus rx will be sent to his pharmacy for insurance purposes.

## 2019-06-13 NOTE — Progress Notes (Addendum)
Patient here for first Twinrix per Earlie Counts. 0.30mL given in left deltoid IM. Patient tolerated well. VIS given.

## 2019-06-18 ENCOUNTER — Ambulatory Visit: Payer: PPO

## 2019-06-19 DIAGNOSIS — J3081 Allergic rhinitis due to animal (cat) (dog) hair and dander: Secondary | ICD-10-CM | POA: Diagnosis not present

## 2019-06-19 DIAGNOSIS — J301 Allergic rhinitis due to pollen: Secondary | ICD-10-CM | POA: Diagnosis not present

## 2019-06-19 DIAGNOSIS — J3089 Other allergic rhinitis: Secondary | ICD-10-CM | POA: Diagnosis not present

## 2019-06-24 ENCOUNTER — Ambulatory Visit: Payer: PPO

## 2019-06-25 ENCOUNTER — Ambulatory Visit (INDEPENDENT_AMBULATORY_CARE_PROVIDER_SITE_OTHER): Payer: PPO

## 2019-06-25 ENCOUNTER — Other Ambulatory Visit: Payer: Self-pay

## 2019-06-25 DIAGNOSIS — Z23 Encounter for immunization: Secondary | ICD-10-CM

## 2019-06-25 NOTE — Progress Notes (Signed)
Patient here for 2nd twinrix, he is getting this as an "accelerated series" as indicated by Debbrah Alar to get this at 1 , 7 and 21 days with a buster in one year. He is scheduled for the 3rd injection on 07-15-19.

## 2019-06-27 ENCOUNTER — Other Ambulatory Visit: Payer: Self-pay | Admitting: Family

## 2019-06-27 DIAGNOSIS — J3089 Other allergic rhinitis: Secondary | ICD-10-CM | POA: Diagnosis not present

## 2019-06-27 DIAGNOSIS — J301 Allergic rhinitis due to pollen: Secondary | ICD-10-CM | POA: Diagnosis not present

## 2019-06-27 DIAGNOSIS — J3081 Allergic rhinitis due to animal (cat) (dog) hair and dander: Secondary | ICD-10-CM | POA: Diagnosis not present

## 2019-07-02 DIAGNOSIS — J301 Allergic rhinitis due to pollen: Secondary | ICD-10-CM | POA: Diagnosis not present

## 2019-07-02 DIAGNOSIS — J3089 Other allergic rhinitis: Secondary | ICD-10-CM | POA: Diagnosis not present

## 2019-07-02 DIAGNOSIS — J3081 Allergic rhinitis due to animal (cat) (dog) hair and dander: Secondary | ICD-10-CM | POA: Diagnosis not present

## 2019-07-08 DIAGNOSIS — J3089 Other allergic rhinitis: Secondary | ICD-10-CM | POA: Diagnosis not present

## 2019-07-08 DIAGNOSIS — J3081 Allergic rhinitis due to animal (cat) (dog) hair and dander: Secondary | ICD-10-CM | POA: Diagnosis not present

## 2019-07-08 DIAGNOSIS — J301 Allergic rhinitis due to pollen: Secondary | ICD-10-CM | POA: Diagnosis not present

## 2019-07-14 ENCOUNTER — Other Ambulatory Visit: Payer: Self-pay | Admitting: Family

## 2019-07-14 DIAGNOSIS — J3081 Allergic rhinitis due to animal (cat) (dog) hair and dander: Secondary | ICD-10-CM | POA: Diagnosis not present

## 2019-07-14 DIAGNOSIS — J3089 Other allergic rhinitis: Secondary | ICD-10-CM | POA: Diagnosis not present

## 2019-07-14 DIAGNOSIS — J301 Allergic rhinitis due to pollen: Secondary | ICD-10-CM | POA: Diagnosis not present

## 2019-07-15 ENCOUNTER — Ambulatory Visit (INDEPENDENT_AMBULATORY_CARE_PROVIDER_SITE_OTHER): Payer: PPO

## 2019-07-15 ENCOUNTER — Other Ambulatory Visit: Payer: Self-pay

## 2019-07-15 ENCOUNTER — Encounter: Payer: Self-pay | Admitting: Family

## 2019-07-15 DIAGNOSIS — Z03818 Encounter for observation for suspected exposure to other biological agents ruled out: Secondary | ICD-10-CM | POA: Diagnosis not present

## 2019-07-15 DIAGNOSIS — Z23 Encounter for immunization: Secondary | ICD-10-CM

## 2019-07-15 DIAGNOSIS — Z20822 Contact with and (suspected) exposure to covid-19: Secondary | ICD-10-CM | POA: Diagnosis not present

## 2019-07-15 DIAGNOSIS — Z20828 Contact with and (suspected) exposure to other viral communicable diseases: Secondary | ICD-10-CM | POA: Diagnosis not present

## 2019-07-15 MED ORDER — AZITHROMYCIN 250 MG PO TABS: ORAL_TABLET | ORAL | 0 refills | Status: DC

## 2019-07-15 NOTE — Progress Notes (Signed)
Patient here for third and final twinrix. 43mL given in right deltoid IM. Patient tolerated well. VIS given.

## 2019-07-18 ENCOUNTER — Encounter: Payer: Self-pay | Admitting: Internal Medicine

## 2019-07-26 ENCOUNTER — Other Ambulatory Visit: Payer: Self-pay | Admitting: Family

## 2019-07-28 ENCOUNTER — Encounter: Payer: Self-pay | Admitting: Family

## 2019-07-28 DIAGNOSIS — J3081 Allergic rhinitis due to animal (cat) (dog) hair and dander: Secondary | ICD-10-CM | POA: Diagnosis not present

## 2019-07-28 DIAGNOSIS — J301 Allergic rhinitis due to pollen: Secondary | ICD-10-CM | POA: Diagnosis not present

## 2019-07-28 DIAGNOSIS — J3089 Other allergic rhinitis: Secondary | ICD-10-CM | POA: Diagnosis not present

## 2019-08-04 DIAGNOSIS — L570 Actinic keratosis: Secondary | ICD-10-CM | POA: Diagnosis not present

## 2019-08-04 DIAGNOSIS — C44311 Basal cell carcinoma of skin of nose: Secondary | ICD-10-CM | POA: Diagnosis not present

## 2019-08-05 DIAGNOSIS — J3089 Other allergic rhinitis: Secondary | ICD-10-CM | POA: Diagnosis not present

## 2019-08-05 DIAGNOSIS — J301 Allergic rhinitis due to pollen: Secondary | ICD-10-CM | POA: Diagnosis not present

## 2019-08-05 DIAGNOSIS — J3081 Allergic rhinitis due to animal (cat) (dog) hair and dander: Secondary | ICD-10-CM | POA: Diagnosis not present

## 2019-08-06 ENCOUNTER — Other Ambulatory Visit: Payer: Self-pay | Admitting: Family

## 2019-08-15 DIAGNOSIS — J3089 Other allergic rhinitis: Secondary | ICD-10-CM | POA: Diagnosis not present

## 2019-08-15 DIAGNOSIS — J301 Allergic rhinitis due to pollen: Secondary | ICD-10-CM | POA: Diagnosis not present

## 2019-08-15 DIAGNOSIS — J3081 Allergic rhinitis due to animal (cat) (dog) hair and dander: Secondary | ICD-10-CM | POA: Diagnosis not present

## 2019-08-22 DIAGNOSIS — J3089 Other allergic rhinitis: Secondary | ICD-10-CM | POA: Diagnosis not present

## 2019-08-22 DIAGNOSIS — J3081 Allergic rhinitis due to animal (cat) (dog) hair and dander: Secondary | ICD-10-CM | POA: Diagnosis not present

## 2019-08-22 DIAGNOSIS — J301 Allergic rhinitis due to pollen: Secondary | ICD-10-CM | POA: Diagnosis not present

## 2019-08-26 DIAGNOSIS — Z85828 Personal history of other malignant neoplasm of skin: Secondary | ICD-10-CM | POA: Diagnosis not present

## 2019-08-26 DIAGNOSIS — C44311 Basal cell carcinoma of skin of nose: Secondary | ICD-10-CM | POA: Diagnosis not present

## 2019-08-29 ENCOUNTER — Other Ambulatory Visit: Payer: Self-pay | Admitting: Family

## 2019-08-29 DIAGNOSIS — J3089 Other allergic rhinitis: Secondary | ICD-10-CM | POA: Diagnosis not present

## 2019-08-29 DIAGNOSIS — J301 Allergic rhinitis due to pollen: Secondary | ICD-10-CM | POA: Diagnosis not present

## 2019-08-29 DIAGNOSIS — J3081 Allergic rhinitis due to animal (cat) (dog) hair and dander: Secondary | ICD-10-CM | POA: Diagnosis not present

## 2019-09-05 ENCOUNTER — Ambulatory Visit (AMBULATORY_SURGERY_CENTER): Payer: Self-pay | Admitting: *Deleted

## 2019-09-05 ENCOUNTER — Other Ambulatory Visit: Payer: Self-pay

## 2019-09-05 VITALS — Ht 71.0 in | Wt 163.6 lb

## 2019-09-05 DIAGNOSIS — Z86010 Personal history of colonic polyps: Secondary | ICD-10-CM

## 2019-09-05 MED ORDER — SUTAB 1479-225-188 MG PO TABS: 1.0000 | ORAL_TABLET | Freq: Once | ORAL | 0 refills | Status: AC

## 2019-09-05 NOTE — Progress Notes (Signed)
2nd dose of covid vaccine 05-17-19  Pt is aware that care partner will wait in the car during procedure; if they feel like they will be too hot or cold to wait in the car; they may wait in the 4 th floor lobby. Patient is aware to bring only one care partner. We want them to wear a mask (we do not have any that we can provide them), practice social distancing, and we will check their temperatures when they get here.  I did remind the patient that their care partner needs to stay in the parking lot the entire time and have a cell phone available, we will call them when the pt is ready for discharge. Patient will wear mask into building.   No egg or soy allergy  No home oxygen use   No medications for weight loss taken  Pt denies constipation issues- unless he has a lengthy plane trip, but normally no   No trouble with anesthesia, difficulty with intubation or hx/fam hx of malignant hyperthermia per pt   Sutab code put into RX and paper copy given to pt to show pharmacy

## 2019-09-08 DIAGNOSIS — J3081 Allergic rhinitis due to animal (cat) (dog) hair and dander: Secondary | ICD-10-CM | POA: Diagnosis not present

## 2019-09-08 DIAGNOSIS — J301 Allergic rhinitis due to pollen: Secondary | ICD-10-CM | POA: Diagnosis not present

## 2019-09-08 DIAGNOSIS — J3089 Other allergic rhinitis: Secondary | ICD-10-CM | POA: Diagnosis not present

## 2019-09-10 ENCOUNTER — Encounter: Payer: Self-pay | Admitting: Internal Medicine

## 2019-09-17 DIAGNOSIS — J301 Allergic rhinitis due to pollen: Secondary | ICD-10-CM | POA: Diagnosis not present

## 2019-09-17 DIAGNOSIS — J3089 Other allergic rhinitis: Secondary | ICD-10-CM | POA: Diagnosis not present

## 2019-09-17 DIAGNOSIS — J3081 Allergic rhinitis due to animal (cat) (dog) hair and dander: Secondary | ICD-10-CM | POA: Diagnosis not present

## 2019-09-19 ENCOUNTER — Ambulatory Visit (AMBULATORY_SURGERY_CENTER): Payer: PPO | Admitting: Internal Medicine

## 2019-09-19 ENCOUNTER — Encounter: Payer: Self-pay | Admitting: Internal Medicine

## 2019-09-19 ENCOUNTER — Other Ambulatory Visit: Payer: Self-pay

## 2019-09-19 VITALS — BP 125/72 | HR 50 | Temp 97.3°F | Resp 16 | Ht 71.0 in | Wt 163.0 lb

## 2019-09-19 DIAGNOSIS — J3081 Allergic rhinitis due to animal (cat) (dog) hair and dander: Secondary | ICD-10-CM | POA: Diagnosis not present

## 2019-09-19 DIAGNOSIS — J3089 Other allergic rhinitis: Secondary | ICD-10-CM | POA: Diagnosis not present

## 2019-09-19 DIAGNOSIS — I10 Essential (primary) hypertension: Secondary | ICD-10-CM | POA: Diagnosis not present

## 2019-09-19 DIAGNOSIS — Z86010 Personal history of colonic polyps: Secondary | ICD-10-CM | POA: Diagnosis not present

## 2019-09-19 DIAGNOSIS — J301 Allergic rhinitis due to pollen: Secondary | ICD-10-CM | POA: Diagnosis not present

## 2019-09-19 MED ORDER — SODIUM CHLORIDE 0.9 % IV SOLN: 500.0000 mL | Freq: Once | INTRAVENOUS | Status: DC

## 2019-09-19 NOTE — Progress Notes (Signed)
Report to PACU, RN, vss, BBS= Clear.  

## 2019-09-19 NOTE — Patient Instructions (Signed)
Discharge instructions given. Handouts on diverticulosis and hemorrhoids. Resume previous medications. YOU HAD AN ENDOSCOPIC PROCEDURE TODAY AT THE [facility]:   Refer to the procedure report that was given to you for any specific questions about what was found during the examination.  If the procedure report does not answer your questions, please call your gastroenterologist to clarify.  If you requested that your care partner not be given the details of your procedure findings, then the procedure report has been included in a sealed envelope for you to review at your convenience later.  YOU SHOULD EXPECT: Some feelings of bloating in the abdomen. Passage of more gas than usual.  Walking can help get rid of the air that was put into your GI tract during the procedure and reduce the bloating. If you had a lower endoscopy (such as a colonoscopy or flexible sigmoidoscopy) you may notice spotting of blood in your stool or on the toilet paper. If you underwent a bowel prep for your procedure, you may not have a normal bowel movement for a few days.  Please Note:  You might notice some irritation and congestion in your nose or some drainage.  This is from the oxygen used during your procedure.  There is no need for concern and it should clear up in a day or so.  SYMPTOMS TO REPORT IMMEDIATELY:   Following lower endoscopy (colonoscopy or flexible sigmoidoscopy):  Excessive amounts of blood in the stool  Significant tenderness or worsening of abdominal pains  Swelling of the abdomen that is new, acute  Fever of 100F or higher   For urgent or emergent issues, a gastroenterologist can be reached at any hour by calling [phone]. Do not use MyChart messaging for urgent concerns.    DIET:  We do recommend a small meal at first, but then you may proceed to your regular diet.  Drink plenty of fluids but you should avoid alcoholic beverages for 24 hours.  ACTIVITY:  You should plan to  take it easy for the rest of today and you should NOT DRIVE or use heavy machinery until tomorrow (because of the sedation medicines used during the test).    FOLLOW UP: Our staff will call the number listed on your records 48-72 hours following your procedure to check on you and address any questions or concerns that you may have regarding the information given to you following your procedure. If we do not reach you, we will leave a message.  We will attempt to reach you two times.  During this call, we will ask if you have developed any symptoms of COVID 19. If you develop any symptoms (ie: fever, flu-like symptoms, shortness of breath, cough etc.) before then, please call [phone].  If you test positive for Covid 19 in the 2 weeks post procedure, please call and report this information to us.    If any biopsies were taken you will be contacted by phone or by letter within the next 1-3 weeks.  Please call us at [phone] if you have not heard about the biopsies in 3 weeks.    SIGNATURES/CONFIDENTIALITY: You and/or your care partner have signed paperwork which will be entered into your electronic medical record.  These signatures attest to the fact that that the information above on your After Visit Summary has been reviewed and is understood.  Full responsibility of the confidentiality of this discharge information lies with you and/or your care-partner. 

## 2019-09-19 NOTE — Op Note (Signed)
[place] [facility] Patient Name: Gregory Brennan Procedure Date: 09/19/2019 11:36 AM MRN: [id number] Endoscopist: Docia Chuck. [name] , MD Age: 74 Referring MD:  Date of Birth: [birth year] Gender: Male Account #: [id number] Procedure:                Colonoscopy Indications:              High risk colon cancer surveillance: Personal                            history of multiple (3 or more) adenomas. Previous                            examinations include 2010 in [place] (2 small                            polyps); 2018 (6 small polyps) Medicines:                Monitored Anesthesia Care Procedure:                Pre-Anesthesia Assessment:                           - Prior to the procedure, a History and Physical                            was performed, and patient medications and                            allergies were reviewed. The patient's tolerance of                            previous anesthesia was also reviewed. The risks                            and benefits of the procedure and the sedation                            options and risks were discussed with the patient.                            All questions were answered, and informed consent                            was obtained. Prior Anticoagulants: The patient has                            taken no previous anticoagulant or antiplatelet                            agents. ASA Grade Assessment: II - A patient with                            mild systemic disease. After reviewing the risks  and benefits, the patient was deemed in                            satisfactory condition to undergo the procedure.                           After obtaining informed consent, the colonoscope                            was passed under direct vision. Throughout the                            procedure, the patient's blood pressure, pulse, and                            oxygen saturations were monitored  continuously. The                            Colonoscope was introduced through the anus and                            advanced to the the cecum, identified by                            appendiceal orifice and ileocecal valve. The                            ileocecal valve, appendiceal orifice, and rectum                            were photographed. The quality of the bowel                            preparation was good. The colonoscopy was performed                            without difficulty. The patient tolerated the                            procedure well. The bowel preparation used was                            SUPREP via split dose instruction. Scope In: 11:44:31 AM Scope Out: 11:56:26 AM Scope Withdrawal Time: 0 hours 7 minutes 45 seconds  Total Procedure Duration: 0 hours 11 minutes 55 seconds  Findings:                 Multiple diverticula were found in the sigmoid                            colon.                           Internal hemorrhoids were found during retroflexion.  The exam was otherwise without abnormality on                            direct and retroflexion views. Complications:            No immediate complications. Estimated blood loss:                            None. Estimated Blood Loss:     Estimated blood loss: none. Impression:               - Diverticulosis in the sigmoid colon.                           - Internal hemorrhoids.                           - The examination was otherwise normal on direct                            and retroflexion views.                           - No specimens collected. Recommendation:           - Repeat colonoscopy in 5 years for surveillance                            (history of multiple adenomas).                           - Patient has a contact number available for                            emergencies. The signs and symptoms of potential                            delayed  complications were discussed with the                            patient. Return to normal activities tomorrow.                            Written discharge instructions were provided to the                            patient.                           - Resume previous diet.                           - Continue present medications. Docia Chuck. [name], MD 09/19/2019 12:01:34 PM This report has been signed electronically.

## 2019-09-22 ENCOUNTER — Other Ambulatory Visit: Payer: Self-pay | Admitting: Family

## 2019-09-24 ENCOUNTER — Telehealth: Payer: Self-pay | Admitting: *Deleted

## 2019-09-24 DIAGNOSIS — J301 Allergic rhinitis due to pollen: Secondary | ICD-10-CM | POA: Diagnosis not present

## 2019-09-24 DIAGNOSIS — J3089 Other allergic rhinitis: Secondary | ICD-10-CM | POA: Diagnosis not present

## 2019-09-24 DIAGNOSIS — J3081 Allergic rhinitis due to animal (cat) (dog) hair and dander: Secondary | ICD-10-CM | POA: Diagnosis not present

## 2019-09-24 NOTE — Telephone Encounter (Signed)
Message left

## 2019-09-24 NOTE — Telephone Encounter (Signed)
  Follow up Call-  Call back number 09/19/2019  Post procedure Call Back phone  # [phone]  Permission to leave phone message Yes  Some recent data might be hidden     Patient questions:  Do you have a fever, pain , or abdominal swelling? No. Pain Score  0 *  Have you tolerated food without any problems? Yes.    Have you been able to return to your normal activities? Yes.    Do you have any questions about your discharge instructions: Diet   No. Medications  No. Follow up visit  No.  Do you have questions or concerns about your Care? No.  Actions: * If pain score is 4 or above: No action needed, pain <4.  1. Have you developed a fever since your procedure? no  2.   Have you had an respiratory symptoms (SOB or cough) since your procedure? no  3.   Have you tested positive for COVID 19 since your procedure no  4.   Have you had any family members/close contacts diagnosed with the COVID 19 since your procedure?  no   If yes to any of these questions please route to Joylene John, RN and Erenest Rasher, RN

## 2019-10-02 DIAGNOSIS — J3089 Other allergic rhinitis: Secondary | ICD-10-CM | POA: Diagnosis not present

## 2019-10-02 DIAGNOSIS — J3081 Allergic rhinitis due to animal (cat) (dog) hair and dander: Secondary | ICD-10-CM | POA: Diagnosis not present

## 2019-10-02 DIAGNOSIS — J301 Allergic rhinitis due to pollen: Secondary | ICD-10-CM | POA: Diagnosis not present

## 2019-10-09 DIAGNOSIS — J3089 Other allergic rhinitis: Secondary | ICD-10-CM | POA: Diagnosis not present

## 2019-10-09 DIAGNOSIS — J301 Allergic rhinitis due to pollen: Secondary | ICD-10-CM | POA: Diagnosis not present

## 2019-10-09 DIAGNOSIS — J3081 Allergic rhinitis due to animal (cat) (dog) hair and dander: Secondary | ICD-10-CM | POA: Diagnosis not present

## 2019-10-17 DIAGNOSIS — J3089 Other allergic rhinitis: Secondary | ICD-10-CM | POA: Diagnosis not present

## 2019-10-17 DIAGNOSIS — J301 Allergic rhinitis due to pollen: Secondary | ICD-10-CM | POA: Diagnosis not present

## 2019-10-17 DIAGNOSIS — J3081 Allergic rhinitis due to animal (cat) (dog) hair and dander: Secondary | ICD-10-CM | POA: Diagnosis not present

## 2019-10-19 ENCOUNTER — Other Ambulatory Visit: Payer: Self-pay | Admitting: Family

## 2019-10-22 ENCOUNTER — Other Ambulatory Visit: Payer: Self-pay | Admitting: Family

## 2019-10-27 DIAGNOSIS — J3081 Allergic rhinitis due to animal (cat) (dog) hair and dander: Secondary | ICD-10-CM | POA: Diagnosis not present

## 2019-10-27 DIAGNOSIS — J3089 Other allergic rhinitis: Secondary | ICD-10-CM | POA: Diagnosis not present

## 2019-10-27 DIAGNOSIS — J301 Allergic rhinitis due to pollen: Secondary | ICD-10-CM | POA: Diagnosis not present

## 2019-11-07 DIAGNOSIS — J301 Allergic rhinitis due to pollen: Secondary | ICD-10-CM | POA: Diagnosis not present

## 2019-11-07 DIAGNOSIS — J3089 Other allergic rhinitis: Secondary | ICD-10-CM | POA: Diagnosis not present

## 2019-11-07 DIAGNOSIS — J3081 Allergic rhinitis due to animal (cat) (dog) hair and dander: Secondary | ICD-10-CM | POA: Diagnosis not present

## 2019-11-11 ENCOUNTER — Encounter: Payer: Self-pay | Admitting: Family

## 2019-11-12 MED ORDER — CARVEDILOL 6.25 MG PO TABS
6.2500 mg | ORAL_TABLET | Freq: Two times a day (BID) | ORAL | 1 refills | Status: DC
Start: 2019-11-12 — End: 2020-08-05

## 2019-11-14 DIAGNOSIS — J301 Allergic rhinitis due to pollen: Secondary | ICD-10-CM | POA: Diagnosis not present

## 2019-11-14 DIAGNOSIS — J3089 Other allergic rhinitis: Secondary | ICD-10-CM | POA: Diagnosis not present

## 2019-11-14 DIAGNOSIS — J3081 Allergic rhinitis due to animal (cat) (dog) hair and dander: Secondary | ICD-10-CM | POA: Diagnosis not present

## 2019-11-21 DIAGNOSIS — J3081 Allergic rhinitis due to animal (cat) (dog) hair and dander: Secondary | ICD-10-CM | POA: Diagnosis not present

## 2019-11-21 DIAGNOSIS — J301 Allergic rhinitis due to pollen: Secondary | ICD-10-CM | POA: Diagnosis not present

## 2019-11-21 DIAGNOSIS — J3089 Other allergic rhinitis: Secondary | ICD-10-CM | POA: Diagnosis not present

## 2019-11-24 ENCOUNTER — Encounter: Payer: Self-pay | Admitting: Family

## 2019-11-25 DIAGNOSIS — J301 Allergic rhinitis due to pollen: Secondary | ICD-10-CM | POA: Diagnosis not present

## 2019-11-25 DIAGNOSIS — J3089 Other allergic rhinitis: Secondary | ICD-10-CM | POA: Diagnosis not present

## 2019-12-04 ENCOUNTER — Other Ambulatory Visit: Payer: Self-pay | Admitting: Family

## 2019-12-05 DIAGNOSIS — J3089 Other allergic rhinitis: Secondary | ICD-10-CM | POA: Diagnosis not present

## 2019-12-05 DIAGNOSIS — J3081 Allergic rhinitis due to animal (cat) (dog) hair and dander: Secondary | ICD-10-CM | POA: Diagnosis not present

## 2019-12-08 DIAGNOSIS — J3089 Other allergic rhinitis: Secondary | ICD-10-CM | POA: Diagnosis not present

## 2019-12-08 DIAGNOSIS — J3081 Allergic rhinitis due to animal (cat) (dog) hair and dander: Secondary | ICD-10-CM | POA: Diagnosis not present

## 2019-12-08 DIAGNOSIS — J301 Allergic rhinitis due to pollen: Secondary | ICD-10-CM | POA: Diagnosis not present

## 2019-12-17 ENCOUNTER — Other Ambulatory Visit: Payer: Self-pay | Admitting: Family

## 2019-12-19 DIAGNOSIS — J3081 Allergic rhinitis due to animal (cat) (dog) hair and dander: Secondary | ICD-10-CM | POA: Diagnosis not present

## 2019-12-19 DIAGNOSIS — J3089 Other allergic rhinitis: Secondary | ICD-10-CM | POA: Diagnosis not present

## 2019-12-19 DIAGNOSIS — J301 Allergic rhinitis due to pollen: Secondary | ICD-10-CM | POA: Diagnosis not present

## 2019-12-25 DIAGNOSIS — J3081 Allergic rhinitis due to animal (cat) (dog) hair and dander: Secondary | ICD-10-CM | POA: Diagnosis not present

## 2019-12-25 DIAGNOSIS — J3089 Other allergic rhinitis: Secondary | ICD-10-CM | POA: Diagnosis not present

## 2019-12-25 DIAGNOSIS — J301 Allergic rhinitis due to pollen: Secondary | ICD-10-CM | POA: Diagnosis not present

## 2020-01-02 DIAGNOSIS — J3081 Allergic rhinitis due to animal (cat) (dog) hair and dander: Secondary | ICD-10-CM | POA: Diagnosis not present

## 2020-01-02 DIAGNOSIS — J3089 Other allergic rhinitis: Secondary | ICD-10-CM | POA: Diagnosis not present

## 2020-01-02 DIAGNOSIS — J301 Allergic rhinitis due to pollen: Secondary | ICD-10-CM | POA: Diagnosis not present

## 2020-01-09 DIAGNOSIS — J301 Allergic rhinitis due to pollen: Secondary | ICD-10-CM | POA: Diagnosis not present

## 2020-01-09 DIAGNOSIS — J3089 Other allergic rhinitis: Secondary | ICD-10-CM | POA: Diagnosis not present

## 2020-01-09 DIAGNOSIS — J3081 Allergic rhinitis due to animal (cat) (dog) hair and dander: Secondary | ICD-10-CM | POA: Diagnosis not present

## 2020-01-11 ENCOUNTER — Other Ambulatory Visit: Payer: Self-pay | Admitting: Family Medicine

## 2020-01-15 DIAGNOSIS — J3081 Allergic rhinitis due to animal (cat) (dog) hair and dander: Secondary | ICD-10-CM | POA: Diagnosis not present

## 2020-01-15 DIAGNOSIS — J3089 Other allergic rhinitis: Secondary | ICD-10-CM | POA: Diagnosis not present

## 2020-01-15 DIAGNOSIS — J301 Allergic rhinitis due to pollen: Secondary | ICD-10-CM | POA: Diagnosis not present

## 2020-01-19 DIAGNOSIS — J3081 Allergic rhinitis due to animal (cat) (dog) hair and dander: Secondary | ICD-10-CM | POA: Diagnosis not present

## 2020-01-19 DIAGNOSIS — J3089 Other allergic rhinitis: Secondary | ICD-10-CM | POA: Diagnosis not present

## 2020-01-19 DIAGNOSIS — J301 Allergic rhinitis due to pollen: Secondary | ICD-10-CM | POA: Diagnosis not present

## 2020-01-20 DIAGNOSIS — Z20822 Contact with and (suspected) exposure to covid-19: Secondary | ICD-10-CM | POA: Diagnosis not present

## 2020-01-20 DIAGNOSIS — Z03818 Encounter for observation for suspected exposure to other biological agents ruled out: Secondary | ICD-10-CM | POA: Diagnosis not present

## 2020-02-04 DIAGNOSIS — J3089 Other allergic rhinitis: Secondary | ICD-10-CM | POA: Diagnosis not present

## 2020-02-04 DIAGNOSIS — J3081 Allergic rhinitis due to animal (cat) (dog) hair and dander: Secondary | ICD-10-CM | POA: Diagnosis not present

## 2020-02-04 DIAGNOSIS — J301 Allergic rhinitis due to pollen: Secondary | ICD-10-CM | POA: Diagnosis not present

## 2020-02-10 DIAGNOSIS — J3089 Other allergic rhinitis: Secondary | ICD-10-CM | POA: Diagnosis not present

## 2020-02-10 DIAGNOSIS — J301 Allergic rhinitis due to pollen: Secondary | ICD-10-CM | POA: Diagnosis not present

## 2020-02-10 DIAGNOSIS — J3081 Allergic rhinitis due to animal (cat) (dog) hair and dander: Secondary | ICD-10-CM | POA: Diagnosis not present

## 2020-02-16 ENCOUNTER — Other Ambulatory Visit: Payer: Self-pay | Admitting: Family

## 2020-02-20 DIAGNOSIS — J301 Allergic rhinitis due to pollen: Secondary | ICD-10-CM | POA: Diagnosis not present

## 2020-02-20 DIAGNOSIS — J3081 Allergic rhinitis due to animal (cat) (dog) hair and dander: Secondary | ICD-10-CM | POA: Diagnosis not present

## 2020-02-20 DIAGNOSIS — J3089 Other allergic rhinitis: Secondary | ICD-10-CM | POA: Diagnosis not present

## 2020-02-27 DIAGNOSIS — J301 Allergic rhinitis due to pollen: Secondary | ICD-10-CM | POA: Diagnosis not present

## 2020-02-27 DIAGNOSIS — J3089 Other allergic rhinitis: Secondary | ICD-10-CM | POA: Diagnosis not present

## 2020-02-27 DIAGNOSIS — J3081 Allergic rhinitis due to animal (cat) (dog) hair and dander: Secondary | ICD-10-CM | POA: Diagnosis not present

## 2020-03-04 DIAGNOSIS — J3081 Allergic rhinitis due to animal (cat) (dog) hair and dander: Secondary | ICD-10-CM | POA: Diagnosis not present

## 2020-03-04 DIAGNOSIS — J301 Allergic rhinitis due to pollen: Secondary | ICD-10-CM | POA: Diagnosis not present

## 2020-03-04 DIAGNOSIS — J3089 Other allergic rhinitis: Secondary | ICD-10-CM | POA: Diagnosis not present

## 2020-03-09 ENCOUNTER — Other Ambulatory Visit: Payer: Self-pay | Admitting: Family

## 2020-03-10 DIAGNOSIS — J301 Allergic rhinitis due to pollen: Secondary | ICD-10-CM | POA: Diagnosis not present

## 2020-03-10 DIAGNOSIS — J3089 Other allergic rhinitis: Secondary | ICD-10-CM | POA: Diagnosis not present

## 2020-03-10 DIAGNOSIS — J3081 Allergic rhinitis due to animal (cat) (dog) hair and dander: Secondary | ICD-10-CM | POA: Diagnosis not present

## 2020-03-17 DIAGNOSIS — J301 Allergic rhinitis due to pollen: Secondary | ICD-10-CM | POA: Diagnosis not present

## 2020-03-17 DIAGNOSIS — J3081 Allergic rhinitis due to animal (cat) (dog) hair and dander: Secondary | ICD-10-CM | POA: Diagnosis not present

## 2020-03-17 DIAGNOSIS — J3089 Other allergic rhinitis: Secondary | ICD-10-CM | POA: Diagnosis not present

## 2020-03-26 DIAGNOSIS — J3089 Other allergic rhinitis: Secondary | ICD-10-CM | POA: Diagnosis not present

## 2020-03-26 DIAGNOSIS — J3081 Allergic rhinitis due to animal (cat) (dog) hair and dander: Secondary | ICD-10-CM | POA: Diagnosis not present

## 2020-03-26 DIAGNOSIS — J301 Allergic rhinitis due to pollen: Secondary | ICD-10-CM | POA: Diagnosis not present

## 2020-04-03 ENCOUNTER — Other Ambulatory Visit: Payer: Self-pay | Admitting: Family

## 2020-04-04 ENCOUNTER — Other Ambulatory Visit: Payer: Self-pay | Admitting: Family

## 2020-04-05 NOTE — Telephone Encounter (Signed)
Please contact pt to schedule a follow up visit.  

## 2020-04-06 NOTE — Telephone Encounter (Signed)
LMOM informing Pt to call back to schedule visit.

## 2020-04-07 NOTE — Telephone Encounter (Signed)
Appt 04/27/20 scheduled.

## 2020-04-08 DIAGNOSIS — J3081 Allergic rhinitis due to animal (cat) (dog) hair and dander: Secondary | ICD-10-CM | POA: Diagnosis not present

## 2020-04-08 DIAGNOSIS — J3089 Other allergic rhinitis: Secondary | ICD-10-CM | POA: Diagnosis not present

## 2020-04-08 DIAGNOSIS — J301 Allergic rhinitis due to pollen: Secondary | ICD-10-CM | POA: Diagnosis not present

## 2020-04-15 DIAGNOSIS — H43813 Vitreous degeneration, bilateral: Secondary | ICD-10-CM | POA: Diagnosis not present

## 2020-04-15 DIAGNOSIS — H5213 Myopia, bilateral: Secondary | ICD-10-CM | POA: Diagnosis not present

## 2020-04-15 DIAGNOSIS — H10413 Chronic giant papillary conjunctivitis, bilateral: Secondary | ICD-10-CM | POA: Diagnosis not present

## 2020-04-15 DIAGNOSIS — H2513 Age-related nuclear cataract, bilateral: Secondary | ICD-10-CM | POA: Diagnosis not present

## 2020-04-15 DIAGNOSIS — H524 Presbyopia: Secondary | ICD-10-CM | POA: Diagnosis not present

## 2020-04-15 DIAGNOSIS — H52223 Regular astigmatism, bilateral: Secondary | ICD-10-CM | POA: Diagnosis not present

## 2020-04-16 DIAGNOSIS — J3081 Allergic rhinitis due to animal (cat) (dog) hair and dander: Secondary | ICD-10-CM | POA: Diagnosis not present

## 2020-04-16 DIAGNOSIS — J301 Allergic rhinitis due to pollen: Secondary | ICD-10-CM | POA: Diagnosis not present

## 2020-04-16 DIAGNOSIS — J3089 Other allergic rhinitis: Secondary | ICD-10-CM | POA: Diagnosis not present

## 2020-04-23 DIAGNOSIS — J301 Allergic rhinitis due to pollen: Secondary | ICD-10-CM | POA: Diagnosis not present

## 2020-04-23 DIAGNOSIS — J3089 Other allergic rhinitis: Secondary | ICD-10-CM | POA: Diagnosis not present

## 2020-04-23 DIAGNOSIS — J3081 Allergic rhinitis due to animal (cat) (dog) hair and dander: Secondary | ICD-10-CM | POA: Diagnosis not present

## 2020-04-25 ENCOUNTER — Other Ambulatory Visit: Payer: Self-pay | Admitting: Family

## 2020-04-27 ENCOUNTER — Other Ambulatory Visit: Payer: Self-pay

## 2020-04-27 ENCOUNTER — Encounter: Payer: Self-pay | Admitting: Family

## 2020-04-27 ENCOUNTER — Ambulatory Visit: Payer: PPO | Admitting: Family

## 2020-04-27 ENCOUNTER — Ambulatory Visit (INDEPENDENT_AMBULATORY_CARE_PROVIDER_SITE_OTHER): Payer: PPO | Admitting: Family

## 2020-04-27 VITALS — BP 172/82 | HR 65 | Temp 98.7°F | Resp 16 | Ht 71.0 in | Wt 167.0 lb

## 2020-04-27 DIAGNOSIS — I10 Essential (primary) hypertension: Secondary | ICD-10-CM | POA: Diagnosis not present

## 2020-04-27 MED ORDER — AMLODIPINE BESYLATE 5 MG PO TABS: 5.0000 mg | ORAL_TABLET | Freq: Every day | ORAL | 3 refills | Status: DC

## 2020-04-27 NOTE — Patient Instructions (Addendum)
Please increase amlodipine from 2.5mg  to 5mg  once daily.  Send me your blood pressure readings/heart rates on Friday .

## 2020-04-27 NOTE — Progress Notes (Signed)
Subjective:    Patient ID: Gregory Brennan, male    DOB: 1945-08-28, 75 y.o.   MRN: [id number]  HPI  Patient is a 75 yr old male who presents today for follow up.  HTN- Reports a lot of stress recently.  Reports that bp 119/77 this AM.    Max home readings 130-135 sbp.    Takes coreg 6.25 bid, hydralazine 25mg  QID and amlodipine 2.5mg  Denies LE edema.   BP Readings from Last 3 Encounters:  04/27/20 (!) 172/82  09/19/19 125/72  05/23/19 (!) 158/75    Review of Systems See HPI  Past Medical History:  Diagnosis Date  . Allergy   . Cancer ([place])    basal cell cancer- nose  . Cataract   . Heart murmur    'Possibly gone away"- per pt  . Hypertension   . MRSA (methicillin resistant staph aureus) culture positive 2007  . Rheumatic fever    age 55     Social History   Socioeconomic History  . Marital status: Married    Spouse name: Not on file  . Number of children: Not on file  . Years of education: Not on file  . Highest education level: Not on file  Occupational History  . Not on file  Tobacco Use  . Smoking status: Never Smoker  . Smokeless tobacco: Never Used  Vaping Use  . Vaping Use: Never used  Substance and Sexual Activity  . Alcohol use: No  . Drug use: No  . Sexual activity: Yes  Other Topics Concern  . Not on file  Social History Narrative   Moved from 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 ABDOMINAL HERNIA REPAIR  12/31/2011  . COLONOSCOPY    .  SKIN CANCER EXCISION     basal cell- nose    Family History  Problem Relation Age of Onset  . Emphysema Mother   . Hypertension Father   . Colon cancer Neg Hx   . Colon polyps Neg Hx   . Esophageal cancer Neg Hx   . Rectal cancer Neg Hx   . Stomach cancer Neg Hx     Allergies  Allergen Reactions  . Lisinopril     Stopped working  . Penicillins Rash    Current Outpatient Medications on File Prior to Visit  Medication Sig Dispense Refill  . amLODipine (NORVASC) 2.5 MG tablet Take 1 tablet (2.5 mg total) by mouth daily. 90 tablet 1  . azelastine (ASTELIN) 0.1 % nasal spray Place 2 sprays into both nostrils 2 (two) times daily. Use in each nostril as directed 30 mL 12  . carvedilol (COREG) 6.25 MG tablet Take 1 tablet (6.25 mg total) by mouth 2 (two) times daily with a meal. 180 tablet 1  . cetirizine (ZYRTEC) 10 MG tablet Take 10 mg by mouth daily.    . fluticasone (FLONASE) 50 MCG/ACT nasal spray Place  1 spray into both nostrils daily as needed.     . hydrALAZINE (APRESOLINE) 25 MG tablet Take 1 tablet (25 mg total) by mouth 4 (four) times daily. 120 tablet 3  . montelukast (SINGULAIR) 10 MG tablet TAKE 1 TABLET BY MOUTH EVERYDAY AT BEDTIME 90 tablet 0  . olopatadine (PATANOL) 0.1 % ophthalmic solution INSTILL 1 DROP INTO BOTH EYES TWICE A DAY 5 mL 5   Current Facility-Administered Medications on File Prior to Visit  Medication Dose Route Frequency Provider Last Rate Last Admin  . 0.9 %  sodium chloride infusion  500 mL Intravenous Once Irene Shipper, MD        BP (!) 172/82 (BP Location: Right Arm, Patient Position: Sitting, Cuff Size: Small)   Pulse 65   Temp 98.7 F (37.1 C) (Oral)   Resp 16   Ht 5\' 11"  (1.803 m)   Wt 167 lb (75.8 kg)   SpO2 100%   BMI 23.29 kg/m       Objective:   Physical Exam Constitutional:      General: He is not in acute distress.    Appearance: He is well-developed and well-nourished.  HENT:     Head: Normocephalic and atraumatic.   Cardiovascular:     Rate and Rhythm: Normal rate and regular rhythm.     Heart sounds: No murmur heard.   Pulmonary:     Effort: Pulmonary effort is normal. No respiratory distress.     Breath sounds: Normal breath sounds. No wheezing or rales.  Musculoskeletal:        General: No edema.  Skin:    General: Skin is warm and dry.  Neurological:     Mental Status: He is alert and oriented to person, place, and time.  Psychiatric:        Mood and Affect: Mood and affect normal.        Behavior: Behavior normal.        Thought Content: Thought content normal.           Assessment & Plan:  Hypertension-she reports much lower blood pressures at home.  He is bothered by the dosing schedule of the hydralazine and is hoping to simplify his blood pressure regimen.  Previously he had some lower extremity edema on the 5 mg of amlodipine, however at this point he states he is okay with dealing with edema if it simplifies his regimen.  Since his blood pressure is high today I have advised him to increase his amlodipine from 2.5 to 5 mg once daily.  He will continue his other medications and send me his blood pressure readings on Friday.  At that time we can determine whether or not hydralazine can be discontinued. Check cmet.  This visit occurred during the SARS-CoV-2 public health emergency.  Safety protocols were in place, including screening questions prior to the visit, additional usage of staff PPE, and extensive cleaning of exam room while observing appropriate contact time as indicated for disinfecting solutions.

## 2020-04-28 LAB — COMPREHENSIVE METABOLIC PANEL
ALT: 21 U/L (ref 0–53)
AST: 24 U/L (ref 0–37)
Albumin: 4.5 g/dL (ref 3.5–5.2)
Alkaline Phosphatase: 77 U/L (ref 39–117)
BUN: 9 mg/dL (ref 6–23)
CO2: 31 mEq/L (ref 19–32)
Calcium: 9.2 mg/dL (ref 8.4–10.5)
Chloride: 95 mEq/L — ABNORMAL LOW (ref 96–112)
Creatinine, Ser: 0.89 mg/dL (ref 0.40–1.50)
GFR: 85.4 mL/min (ref >60.00)
Glucose, Bld: 96 mg/dL (ref 70–99)
Potassium: 4.3 mEq/L (ref 3.5–5.1)
Sodium: 129 mEq/L — ABNORMAL LOW (ref 135–145)
Total Bilirubin: 0.6 mg/dL (ref 0.2–1.2)
Total Protein: 6.8 g/dL (ref 6.0–8.3)

## 2020-04-29 ENCOUNTER — Other Ambulatory Visit: Payer: Self-pay | Admitting: Family

## 2020-04-30 ENCOUNTER — Encounter: Payer: Self-pay | Admitting: Family

## 2020-04-30 DIAGNOSIS — J301 Allergic rhinitis due to pollen: Secondary | ICD-10-CM | POA: Diagnosis not present

## 2020-04-30 DIAGNOSIS — J3089 Other allergic rhinitis: Secondary | ICD-10-CM | POA: Diagnosis not present

## 2020-04-30 DIAGNOSIS — J3081 Allergic rhinitis due to animal (cat) (dog) hair and dander: Secondary | ICD-10-CM | POA: Diagnosis not present

## 2020-05-01 ENCOUNTER — Other Ambulatory Visit: Payer: Self-pay | Admitting: Family

## 2020-05-01 MED ORDER — HYDRALAZINE HCL 25 MG PO TABS: 25.0000 mg | ORAL_TABLET | Freq: Two times a day (BID) | ORAL | 3 refills | Status: DC

## 2020-05-06 DIAGNOSIS — J3089 Other allergic rhinitis: Secondary | ICD-10-CM | POA: Diagnosis not present

## 2020-05-06 DIAGNOSIS — J301 Allergic rhinitis due to pollen: Secondary | ICD-10-CM | POA: Diagnosis not present

## 2020-05-06 DIAGNOSIS — J3081 Allergic rhinitis due to animal (cat) (dog) hair and dander: Secondary | ICD-10-CM | POA: Diagnosis not present

## 2020-05-07 ENCOUNTER — Encounter: Payer: Self-pay | Admitting: Family

## 2020-05-12 DIAGNOSIS — H1045 Other chronic allergic conjunctivitis: Secondary | ICD-10-CM | POA: Diagnosis not present

## 2020-05-12 DIAGNOSIS — J301 Allergic rhinitis due to pollen: Secondary | ICD-10-CM | POA: Diagnosis not present

## 2020-05-12 DIAGNOSIS — J3081 Allergic rhinitis due to animal (cat) (dog) hair and dander: Secondary | ICD-10-CM | POA: Diagnosis not present

## 2020-05-12 DIAGNOSIS — J3089 Other allergic rhinitis: Secondary | ICD-10-CM | POA: Diagnosis not present

## 2020-05-21 DIAGNOSIS — J301 Allergic rhinitis due to pollen: Secondary | ICD-10-CM | POA: Diagnosis not present

## 2020-05-21 DIAGNOSIS — J3081 Allergic rhinitis due to animal (cat) (dog) hair and dander: Secondary | ICD-10-CM | POA: Diagnosis not present

## 2020-05-21 DIAGNOSIS — J3089 Other allergic rhinitis: Secondary | ICD-10-CM | POA: Diagnosis not present

## 2020-05-25 ENCOUNTER — Ambulatory Visit (INDEPENDENT_AMBULATORY_CARE_PROVIDER_SITE_OTHER): Payer: PPO | Admitting: Family

## 2020-05-25 ENCOUNTER — Other Ambulatory Visit: Payer: Self-pay

## 2020-05-25 ENCOUNTER — Encounter: Payer: Self-pay | Admitting: Family

## 2020-05-25 VITALS — BP 148/77 | HR 58 | Temp 98.4°F | Resp 16 | Ht 71.0 in | Wt 164.8 lb

## 2020-05-25 DIAGNOSIS — I10 Essential (primary) hypertension: Secondary | ICD-10-CM | POA: Diagnosis not present

## 2020-05-25 DIAGNOSIS — E871 Hypo-osmolality and hyponatremia: Secondary | ICD-10-CM

## 2020-05-25 MED ORDER — TETANUS-DIPHTHERIA TOXOIDS TD 2-2 LF/0.5ML IM SUSP: 0.5000 mL | Freq: Once | INTRAMUSCULAR | 0 refills | Status: AC

## 2020-05-25 NOTE — Progress Notes (Signed)
Subjective:    Patient ID: Gregory Brennan, male    DOB: 1945/04/05, 75 y.o.   MRN: [id number]  HPI  HTN- Last visit we increased amlodipine from 2.5 mg to 5mg , He reports that he generally   bp 131/76 this AM.  Most of his sbp's are 110-115.  He has a new Omron cuff at home.  He is on bid hydrazaline.   BP Readings from Last 3 Encounters:  05/25/20 (!) 148/77  04/27/20 (!) 172/82  09/19/19 125/72   His daughter recently moved home last May.  She was having some health issues and he is pleased that she is with them.   Review of Systems See HPI  Past Medical History:  Diagnosis Date  . Allergy   . Cancer ([place])    basal cell cancer- nose  . Cataract   . Heart murmur    'Possibly gone away"- per pt  . Hypertension   . MRSA (methicillin resistant staph aureus) culture positive 2007  . Rheumatic fever    age 29     Social History   Socioeconomic History  . Marital status: Married    Spouse name: Not on file  . Number of children: Not on file  . Years of education: Not on file  . Highest education level: Not on file  Occupational History  . Not on file  Tobacco Use  . Smoking status: Never Smoker  . Smokeless tobacco: Never Used  Vaping Use  . Vaping Use: Never used  Substance and Sexual Activity  . Alcohol use: No  . Drug use: No  . Sexual activity: Yes  Other Topics Concern  . 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 ABDOMINAL HERNIA REPAIR  12/31/2011  . COLONOSCOPY    . SKIN CANCER EXCISION     basal cell- nose    Family History  Problem Relation Age of Onset  . Emphysema Mother   . Hypertension Father   . Colon cancer Neg Hx   . Colon polyps Neg Hx   . Esophageal cancer Neg Hx   . Rectal cancer Neg Hx   . Stomach cancer Neg Hx     Allergies  Allergen Reactions  . Lisinopril     Stopped working  . Penicillins Rash    Current Outpatient Medications on File Prior to Visit  Medication Sig Dispense Refill  . amLODipine (NORVASC) 5 MG tablet Take 1 tablet (5 mg total) by mouth daily. 30 tablet 3  . azelastine (ASTELIN) 0.1 % nasal spray Place 2 sprays into both nostrils 2 (two) times daily. Use in each nostril as directed 30 mL 12  . carvedilol (COREG) 6.25 MG tablet Take 1 tablet (6.25 mg total) by mouth 2 (two) times daily with a meal. 180 tablet 1  . cetirizine (ZYRTEC) 10 MG tablet Take  10 mg by mouth daily.    . fluticasone (FLONASE) 50 MCG/ACT nasal spray Place 1 spray into both nostrils daily as needed.     . hydrALAZINE (APRESOLINE) 25 MG tablet Take 1 tablet (25 mg total) by mouth in the morning and at bedtime. 120 tablet 3  . montelukast (SINGULAIR) 10 MG tablet TAKE 1 TABLET BY MOUTH EVERYDAY AT BEDTIME 90 tablet 0  . olopatadine (PATANOL) 0.1 % ophthalmic solution INSTILL 1 DROP INTO BOTH EYES TWICE A DAY 5 mL 5   Current Facility-Administered Medications on File Prior to Visit  Medication Dose Route Frequency Provider Last Rate Last Admin  . 0.9 %  sodium chloride infusion  500 mL Intravenous Once Irene Shipper, MD        BP (!) 148/77 (BP Location: Right Arm, Patient Position: Sitting, Cuff Size: Small)   Pulse (!) 58   Temp 98.4 F (36.9 C) (Oral)   Resp 16   Ht 5\' 11"  (1.803 m)   Wt 164 lb 12.8 oz (74.8 kg)   SpO2 100%   BMI 22.98 kg/m       Objective:   Physical Exam Constitutional:      General: He is not in acute distress.    Appearance: He is  well-developed and well-nourished.  HENT:     Head: Normocephalic and atraumatic.  Cardiovascular:     Rate and Rhythm: Normal rate and regular rhythm.     Heart sounds: No murmur heard.   Pulmonary:     Effort: Pulmonary effort is normal. No respiratory distress.     Breath sounds: Normal breath sounds. No wheezing or rales.  Musculoskeletal:        General: No edema.  Skin:    General: Skin is warm and dry.  Neurological:     Mental Status: He is alert and oriented to person, place, and time.  Psychiatric:        Mood and Affect: Mood and affect normal.        Behavior: Behavior normal.        Thought Content: Thought content normal.           Assessment & Plan:  HTN- pt reports tolerating amlodipine and is pleased with his simplified regimen.  Home BP's are excellent- he is always a bit higher in the office. Continue amlodipine 5mg , hydralazine 25mg  bid, carvedilol 6.25mg  bid.   Hyponatremia- he has been liberalizing sodium in his diet and I have encouraged him to do so. Will obtain follow up bmet today.  He is due for a tetanus.  rx sent to pharmacy.   This visit occurred during the SARS-CoV-2 public health emergency.  Safety protocols were in place, including screening questions prior to the visit, additional usage of staff PPE, and extensive cleaning of exam room while observing appropriate contact time as indicated for disinfecting solutions.

## 2020-05-26 LAB — BASIC METABOLIC PANEL
BUN: 9 mg/dL (ref 6–23)
CO2: 28 mEq/L (ref 19–32)
Calcium: 9.4 mg/dL (ref 8.4–10.5)
Chloride: 98 mEq/L (ref 96–112)
Creatinine, Ser: 0.85 mg/dL (ref 0.40–1.50)
GFR: 86.54 mL/min (ref >60.00)
Glucose, Bld: 103 mg/dL — ABNORMAL HIGH (ref 70–99)
Potassium: 4.1 mEq/L (ref 3.5–5.1)
Sodium: 134 mEq/L — ABNORMAL LOW (ref 135–145)

## 2020-05-28 DIAGNOSIS — J3089 Other allergic rhinitis: Secondary | ICD-10-CM | POA: Diagnosis not present

## 2020-05-28 DIAGNOSIS — J3081 Allergic rhinitis due to animal (cat) (dog) hair and dander: Secondary | ICD-10-CM | POA: Diagnosis not present

## 2020-05-28 DIAGNOSIS — J301 Allergic rhinitis due to pollen: Secondary | ICD-10-CM | POA: Diagnosis not present

## 2020-06-02 DIAGNOSIS — J301 Allergic rhinitis due to pollen: Secondary | ICD-10-CM | POA: Diagnosis not present

## 2020-06-02 DIAGNOSIS — J3081 Allergic rhinitis due to animal (cat) (dog) hair and dander: Secondary | ICD-10-CM | POA: Diagnosis not present

## 2020-06-02 DIAGNOSIS — J3089 Other allergic rhinitis: Secondary | ICD-10-CM | POA: Diagnosis not present

## 2020-06-10 DIAGNOSIS — J3089 Other allergic rhinitis: Secondary | ICD-10-CM | POA: Diagnosis not present

## 2020-06-10 DIAGNOSIS — J301 Allergic rhinitis due to pollen: Secondary | ICD-10-CM | POA: Diagnosis not present

## 2020-06-10 DIAGNOSIS — J3081 Allergic rhinitis due to animal (cat) (dog) hair and dander: Secondary | ICD-10-CM | POA: Diagnosis not present

## 2020-06-17 DIAGNOSIS — J301 Allergic rhinitis due to pollen: Secondary | ICD-10-CM | POA: Diagnosis not present

## 2020-06-17 DIAGNOSIS — J3089 Other allergic rhinitis: Secondary | ICD-10-CM | POA: Diagnosis not present

## 2020-06-17 DIAGNOSIS — J3081 Allergic rhinitis due to animal (cat) (dog) hair and dander: Secondary | ICD-10-CM | POA: Diagnosis not present

## 2020-06-21 ENCOUNTER — Telehealth: Payer: Self-pay | Admitting: Family

## 2020-06-22 NOTE — Telephone Encounter (Signed)
Opened in error

## 2020-06-24 DIAGNOSIS — J301 Allergic rhinitis due to pollen: Secondary | ICD-10-CM | POA: Diagnosis not present

## 2020-06-24 DIAGNOSIS — J3089 Other allergic rhinitis: Secondary | ICD-10-CM | POA: Diagnosis not present

## 2020-06-24 DIAGNOSIS — J3081 Allergic rhinitis due to animal (cat) (dog) hair and dander: Secondary | ICD-10-CM | POA: Diagnosis not present

## 2020-07-03 ENCOUNTER — Other Ambulatory Visit: Payer: Self-pay | Admitting: Family

## 2020-07-05 ENCOUNTER — Encounter: Payer: Self-pay | Admitting: Family

## 2020-07-08 ENCOUNTER — Other Ambulatory Visit: Payer: Self-pay | Admitting: Family

## 2020-07-08 DIAGNOSIS — J3081 Allergic rhinitis due to animal (cat) (dog) hair and dander: Secondary | ICD-10-CM | POA: Diagnosis not present

## 2020-07-08 DIAGNOSIS — J301 Allergic rhinitis due to pollen: Secondary | ICD-10-CM | POA: Diagnosis not present

## 2020-07-08 DIAGNOSIS — J3089 Other allergic rhinitis: Secondary | ICD-10-CM | POA: Diagnosis not present

## 2020-07-14 DIAGNOSIS — J3089 Other allergic rhinitis: Secondary | ICD-10-CM | POA: Diagnosis not present

## 2020-07-14 DIAGNOSIS — J301 Allergic rhinitis due to pollen: Secondary | ICD-10-CM | POA: Diagnosis not present

## 2020-07-14 DIAGNOSIS — J3081 Allergic rhinitis due to animal (cat) (dog) hair and dander: Secondary | ICD-10-CM | POA: Diagnosis not present

## 2020-07-22 DIAGNOSIS — J3089 Other allergic rhinitis: Secondary | ICD-10-CM | POA: Diagnosis not present

## 2020-07-22 DIAGNOSIS — J301 Allergic rhinitis due to pollen: Secondary | ICD-10-CM | POA: Diagnosis not present

## 2020-07-22 DIAGNOSIS — J3081 Allergic rhinitis due to animal (cat) (dog) hair and dander: Secondary | ICD-10-CM | POA: Diagnosis not present

## 2020-07-23 ENCOUNTER — Other Ambulatory Visit: Payer: Self-pay | Admitting: Family

## 2020-07-29 DIAGNOSIS — J301 Allergic rhinitis due to pollen: Secondary | ICD-10-CM | POA: Diagnosis not present

## 2020-07-29 DIAGNOSIS — J3089 Other allergic rhinitis: Secondary | ICD-10-CM | POA: Diagnosis not present

## 2020-07-29 DIAGNOSIS — J3081 Allergic rhinitis due to animal (cat) (dog) hair and dander: Secondary | ICD-10-CM | POA: Diagnosis not present

## 2020-08-05 ENCOUNTER — Other Ambulatory Visit: Payer: Self-pay | Admitting: Family

## 2020-08-13 DIAGNOSIS — J301 Allergic rhinitis due to pollen: Secondary | ICD-10-CM | POA: Diagnosis not present

## 2020-08-13 DIAGNOSIS — J3089 Other allergic rhinitis: Secondary | ICD-10-CM | POA: Diagnosis not present

## 2020-08-13 DIAGNOSIS — J3081 Allergic rhinitis due to animal (cat) (dog) hair and dander: Secondary | ICD-10-CM | POA: Diagnosis not present

## 2020-08-18 DIAGNOSIS — J3089 Other allergic rhinitis: Secondary | ICD-10-CM | POA: Diagnosis not present

## 2020-08-18 DIAGNOSIS — J301 Allergic rhinitis due to pollen: Secondary | ICD-10-CM | POA: Diagnosis not present

## 2020-08-18 DIAGNOSIS — J3081 Allergic rhinitis due to animal (cat) (dog) hair and dander: Secondary | ICD-10-CM | POA: Diagnosis not present

## 2020-08-25 DIAGNOSIS — J301 Allergic rhinitis due to pollen: Secondary | ICD-10-CM | POA: Diagnosis not present

## 2020-08-25 DIAGNOSIS — J3081 Allergic rhinitis due to animal (cat) (dog) hair and dander: Secondary | ICD-10-CM | POA: Diagnosis not present

## 2020-08-25 DIAGNOSIS — J3089 Other allergic rhinitis: Secondary | ICD-10-CM | POA: Diagnosis not present

## 2020-09-06 DIAGNOSIS — J3089 Other allergic rhinitis: Secondary | ICD-10-CM | POA: Diagnosis not present

## 2020-09-06 DIAGNOSIS — J301 Allergic rhinitis due to pollen: Secondary | ICD-10-CM | POA: Diagnosis not present

## 2020-09-16 DIAGNOSIS — J3081 Allergic rhinitis due to animal (cat) (dog) hair and dander: Secondary | ICD-10-CM | POA: Diagnosis not present

## 2020-09-16 DIAGNOSIS — J3089 Other allergic rhinitis: Secondary | ICD-10-CM | POA: Diagnosis not present

## 2020-09-16 DIAGNOSIS — J301 Allergic rhinitis due to pollen: Secondary | ICD-10-CM | POA: Diagnosis not present

## 2020-09-24 ENCOUNTER — Ambulatory Visit: Payer: PPO | Admitting: Family

## 2020-09-24 DIAGNOSIS — J3081 Allergic rhinitis due to animal (cat) (dog) hair and dander: Secondary | ICD-10-CM | POA: Diagnosis not present

## 2020-09-24 DIAGNOSIS — J3089 Other allergic rhinitis: Secondary | ICD-10-CM | POA: Diagnosis not present

## 2020-09-24 DIAGNOSIS — J301 Allergic rhinitis due to pollen: Secondary | ICD-10-CM | POA: Diagnosis not present

## 2020-09-25 ENCOUNTER — Other Ambulatory Visit: Payer: Self-pay | Admitting: Family

## 2020-10-01 ENCOUNTER — Other Ambulatory Visit: Payer: Self-pay

## 2020-10-01 ENCOUNTER — Ambulatory Visit (INDEPENDENT_AMBULATORY_CARE_PROVIDER_SITE_OTHER): Payer: PPO | Admitting: Family

## 2020-10-01 DIAGNOSIS — I10 Essential (primary) hypertension: Secondary | ICD-10-CM | POA: Diagnosis not present

## 2020-10-01 DIAGNOSIS — E871 Hypo-osmolality and hyponatremia: Secondary | ICD-10-CM

## 2020-10-01 DIAGNOSIS — Z9109 Other allergy status, other than to drugs and biological substances: Secondary | ICD-10-CM | POA: Diagnosis not present

## 2020-10-01 MED ORDER — HYDRALAZINE HCL 25 MG PO TABS: 25.0000 mg | ORAL_TABLET | Freq: Three times a day (TID) | ORAL | 1 refills | Status: DC

## 2020-10-01 NOTE — Assessment & Plan Note (Signed)
Check sodium level.

## 2020-10-01 NOTE — Assessment & Plan Note (Addendum)
BP Readings from Last 3 Encounters:  10/01/20 130/88  05/25/20 (!) 148/77  04/27/20 (!) 172/82   BP stable with amlodipine 5mg  and carvedilol 6.25mg  bid and hydralazine 25mg  TID.

## 2020-10-01 NOTE — Assessment & Plan Note (Addendum)
Stable on astelin (allergy shots per allergist),  Zyrtec or allegra prn, singulair. Continue same.

## 2020-10-01 NOTE — Progress Notes (Signed)
Subjective:   By signing my name below, I, Shehryar Baig, attest that this documentation has been prepared under the direction and in the presence of Debbrah Alar NP. 10/01/2020   Patient ID: Gregory Brennan, male    DOB: May 06, 1945, 75 y.o.   MRN: [id number]  Chief Complaint  Patient presents with   Hypertension   Follow-up    HPI Patient is in today for office visit  Immunizations- He is not interested in hepatitis screening at this time. He is UTD on the hepatitis vaccines.  Hypertension- His blood pressure is doing well during this visit. He continues taking 6.25 mg carvedilol daily PO, 25 mg hydralazine daily PO, and 5 mg amlodipine daily PO to manage his hypertension and reports doing well on it.   BP Readings from Last 3 Encounters:  10/01/20 130/88  05/25/20 (!) 148/77  04/27/20 (!) 172/82   Allergy- He reports having no new issues with his allergies. He continues taking allergie injections to manage them. He also continues taking OTC allegra and OTC 10 mg zyrtec to manage his allergies and reports no new issues while taking them.  There are no preventive care reminders to display for this patient.   Past Medical History:  Diagnosis Date   Allergy    Cancer ([place])    basal cell cancer- nose   Cataract    Heart murmur    'Possibly gone away"- per pt   Hypertension    MRSA (methicillin resistant staph aureus) culture positive 2007   Rheumatic fever    age 71    Past Surgical History:  Procedure Laterality Date   ABDOMINAL HERNIA REPAIR  12/31/2011   COLONOSCOPY     SKIN CANCER EXCISION     basal cell- nose    Family History  Problem Relation Age of Onset   Emphysema Mother    Hypertension Father    Colon cancer Neg Hx    Colon polyps Neg Hx    Esophageal cancer Neg Hx    Rectal cancer Neg Hx    Stomach cancer Neg Hx     Social History   Socioeconomic History   Marital status: Married    Spouse name: Not on file   Number of children: Not on  file   Years of education: Not on file   Highest education level: Not on file  Occupational History   Not on file  Tobacco Use   Smoking status: Never   Smokeless tobacco: Never  Vaping Use   Vaping Use: Never used  Substance and Sexual Activity   Alcohol use: No   Drug use: No   Sexual activity: Yes  Other Topics Concern   Not on file  Social History Narrative   Moved from [place]    Daughter and son in law live in [place]   Has daughter in [place]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 Gutierrez  tablet 1   azelastine (ASTELIN) 0.1 % nasal spray Place 2 sprays into both nostrils 2 (two) times daily. Use in each nostril as directed 30 mL 12   carvedilol (COREG) 6.25 MG tablet Take 1 tablet (6.25 mg total) by mouth 2 (two) times daily with a meal. 180 tablet 1   cetirizine (ZYRTEC) 10 MG tablet Take 10 mg by mouth daily.     fluticasone (FLONASE) 50 MCG/ACT nasal spray Place 1 spray into both nostrils daily as needed.      montelukast (SINGULAIR) 10 MG tablet TAKE 1 TABLET BY MOUTH EVERYDAY AT BEDTIME 90 tablet 0   olopatadine (PATANOL) 0.1 % ophthalmic solution INSTILL 1 DROP INTO BOTH EYES TWICE A DAY 5 mL 5   hydrALAZINE (APRESOLINE) 25 MG tablet TAKE 1 TABLET (25 MG TOTAL) BY MOUTH 4 (FOUR) TIMES DAILY. 360 tablet 1   Facility-Administered Medications Prior to Visit  Medication Dose Route Frequency Provider Last Rate Last Admin   0.9 %  sodium chloride infusion  500  mL Intravenous Once Irene Shipper, MD        Allergies  Allergen Reactions   Lisinopril     Stopped working   Penicillins Rash    ROS See HPI    Objective:    Physical Exam Constitutional:      General: He is not in acute distress.    Appearance: Normal appearance. He is not ill-appearing.  HENT:     Head: Normocephalic and atraumatic.     Right Ear: External ear normal.     Left Ear: External ear normal.  Eyes:     Extraocular Movements: Extraocular movements intact.     Pupils: Pupils are equal, round, and reactive to light.  Cardiovascular:     Rate and Rhythm: Normal rate and regular rhythm.     Pulses: Normal pulses.     Heart sounds: Normal heart sounds. No murmur heard.   No gallop.  Pulmonary:     Effort: Pulmonary effort is normal. No respiratory distress.     Breath sounds: Normal breath sounds. No wheezing, rhonchi or rales.  Skin:    General: Skin is warm and dry.  Neurological:     Mental Status: He is alert and oriented to person, place, and time.  Psychiatric:        Behavior: Behavior normal.    BP 130/88 (BP Location: Left Arm, Patient Position: Sitting, Cuff Size: Normal)   Pulse (!) 57   Temp 98 F (36.7 C) (Oral)   Resp 18   Ht 5\' 11"  (1.803 m)   Wt 166 lb 3.2 oz (75.4 kg)   SpO2 98%   BMI 23.18 kg/m  Wt Readings from Last 3 Encounters:  10/01/20 166 lb 3.2 oz (75.4 kg)  05/25/20 164 lb 12.8 oz (74.8 kg)  04/27/20 167 lb (75.8 kg)       Assessment & Plan:   Problem List Items Addressed This Visit       Unprioritized   Hyponatremia    Check sodium level.        HTN (hypertension)    BP Readings from Last 3 Encounters:  10/01/20 130/88  05/25/20 (!) 148/77  04/27/20 (!) 172/82  BP stable with amlodipine 5mg  and carvedilol 6.25mg  bid and hydralazine 25mg  TID.       Relevant Medications   hydrALAZINE (APRESOLINE) 25 MG tablet   Other Relevant Orders   Basic metabolic panel   Environmental allergies    Stable on  astelin  (allergy shots per allergist),  Zyrtec or allegra prn, singulair. Continue same.           Meds ordered this encounter  Medications   hydrALAZINE (APRESOLINE) 25 MG tablet    Sig: Take 1 tablet (25 mg total) by mouth 3 (three) times daily.    Dispense:  270 tablet    Refill:  1    Order Specific Question:   Supervising Provider    Answer:   Penni Homans A [[id number]]    I, Debbrah Alar NP, personally preformed the services described in this documentation.  All medical record entries made by the scribe were at my direction and in my presence.  I have reviewed the chart and discharge instructions (if applicable) and agree that the record reflects my personal performance and is accurate and complete. 10/01/2020   I,Shehryar Baig,acting as a Education administrator for Nance Pear, NP.,have documented all relevant documentation on the behalf of Nance Pear, NP,as directed by  Nance Pear, NP while in the presence of Nance Pear, NP.   Nance Pear, NP

## 2020-10-02 LAB — BASIC METABOLIC PANEL
BUN: 11 mg/dL (ref 7–25)
CO2: 28 mmol/L (ref 20–32)
Calcium: 9.4 mg/dL (ref 8.6–10.3)
Chloride: 97 mmol/L — ABNORMAL LOW (ref 98–110)
Creat: 0.8 mg/dL (ref 0.70–1.28)
Glucose, Bld: 93 mg/dL (ref 65–99)
Potassium: 4.4 mmol/L (ref 3.5–5.3)
Sodium: 133 mmol/L — ABNORMAL LOW (ref 135–146)

## 2020-10-05 DIAGNOSIS — J301 Allergic rhinitis due to pollen: Secondary | ICD-10-CM | POA: Diagnosis not present

## 2020-10-05 DIAGNOSIS — J3081 Allergic rhinitis due to animal (cat) (dog) hair and dander: Secondary | ICD-10-CM | POA: Diagnosis not present

## 2020-10-05 DIAGNOSIS — J3089 Other allergic rhinitis: Secondary | ICD-10-CM | POA: Diagnosis not present

## 2020-10-09 ENCOUNTER — Emergency Department (HOSPITAL_BASED_OUTPATIENT_CLINIC_OR_DEPARTMENT_OTHER)
Admission: EM | Admit: 2020-10-09 | Discharge: 2020-10-09 | Disposition: A | Discharge status: Home / Self Care | Payer: PPO | Attending: Emergency Medicine | Admitting: Emergency Medicine

## 2020-10-09 ENCOUNTER — Emergency Department (HOSPITAL_BASED_OUTPATIENT_CLINIC_OR_DEPARTMENT_OTHER): Payer: PPO

## 2020-10-09 ENCOUNTER — Other Ambulatory Visit: Payer: Self-pay

## 2020-10-09 DIAGNOSIS — M7022 Olecranon bursitis, left elbow: Secondary | ICD-10-CM | POA: Diagnosis not present

## 2020-10-09 DIAGNOSIS — Y939 Activity, unspecified: Secondary | ICD-10-CM | POA: Insufficient documentation

## 2020-10-09 DIAGNOSIS — Z87891 Personal history of nicotine dependence: Secondary | ICD-10-CM | POA: Diagnosis not present

## 2020-10-09 DIAGNOSIS — M25522 Pain in left elbow: Secondary | ICD-10-CM | POA: Diagnosis not present

## 2020-10-09 DIAGNOSIS — I10 Essential (primary) hypertension: Secondary | ICD-10-CM | POA: Diagnosis not present

## 2020-10-09 DIAGNOSIS — M7989 Other specified soft tissue disorders: Secondary | ICD-10-CM | POA: Diagnosis not present

## 2020-10-09 DIAGNOSIS — Z85828 Personal history of other malignant neoplasm of skin: Secondary | ICD-10-CM | POA: Insufficient documentation

## 2020-10-09 DIAGNOSIS — M71122 Other infective bursitis, left elbow: Secondary | ICD-10-CM

## 2020-10-09 MED ORDER — DOXYCYCLINE HYCLATE 100 MG PO CAPS: 100.0000 mg | ORAL_CAPSULE | Freq: Two times a day (BID) | ORAL | 0 refills | Status: DC

## 2020-10-09 NOTE — ED Triage Notes (Signed)
Pt c/o left elbow pain.since last night. States tender to touch and swollen. Denies known injury

## 2020-10-09 NOTE — ED Provider Notes (Signed)
[facility] EMERGENCY DEPARTMENT Provider Note   CSN: [id number] Arrival date & time: 10/09/20  0859     History Chief Complaint  Patient presents with   Elbow Pain    Gregory Brennan is a 75 y.o. male.  About a week ago, he thinks he struck his elbow on a door frame.  This was a very minor injury.  Last night, he had trouble sleeping because he developed redness and pain at the posterior elbow.  He denies systemic symptoms.  He is quite active and enjoys biking, going to the gym, and kayaking.  The history is provided by the patient.  Extremity Pain This is a new problem. The current episode started yesterday. The problem occurs constantly. The problem has been rapidly worsening. Pertinent negatives include no chest pain, no abdominal pain, no headaches and no shortness of breath. Exacerbated by: pressure on the area. The symptoms are relieved by rest. Treatments tried: OTC meds. The treatment provided mild relief.      Past Medical History:  Diagnosis Date   Allergy    Cancer ([place])    basal cell cancer- nose   Cataract    Heart murmur    'Possibly gone away"- per pt   Hypertension    MRSA (methicillin resistant staph aureus) culture positive 2007   Rheumatic fever    age 75    Patient Active Problem List   Diagnosis Date Noted   Hyponatremia 03/09/2014   Hyperglycemia 03/02/2014   HTN (hypertension) 12/30/2013   History of rheumatic fever 12/30/2013   Environmental allergies 12/30/2013    Past Surgical History:  Procedure Laterality Date   ABDOMINAL HERNIA REPAIR  12/31/2011   COLONOSCOPY     SKIN CANCER EXCISION     basal cell- nose       Family History  Problem Relation Age of Onset   Emphysema Mother    Hypertension Father    Colon cancer Neg Hx    Colon polyps Neg Hx    Esophageal cancer Neg Hx    Rectal cancer Neg Hx    Stomach cancer Neg Hx     Social History   Tobacco Use   Smoking status: Never   Smokeless tobacco: Never   Vaping Use   Vaping Use: Never used  Substance Use Topics   Alcohol use: No   Drug use: No    Home Medications Prior to Admission medications   Medication Sig Start Date End Date Taking? Authorizing Provider  doxycycline (VIBRAMYCIN) 100 MG capsule Take 1 capsule (100 mg total) by mouth 2 (two) times daily. 10/09/20  Yes Arnaldo Natal, MD  amLODipine (NORVASC) 5 MG tablet TAKE 1 TABLET (5 MG TOTAL) BY MOUTH DAILY. 07/23/20   Debbrah Alar, NP  azelastine (ASTELIN) 0.1 % nasal spray Place 2 sprays into both nostrils 2 (two) times daily. Use in each nostril as directed 09/23/19   Debbrah Alar, NP  carvedilol (COREG) 6.25 MG tablet Take 1 tablet (6.25 mg total) by mouth 2 (two) times daily with a meal. 08/05/20   Debbrah Alar, NP  cetirizine (ZYRTEC) 10 MG tablet Take 10 mg by mouth daily.    [provider]  fluticasone (FLONASE) 50 MCG/ACT nasal spray Place 1 spray into both nostrils daily as needed.     [provider]  hydrALAZINE (APRESOLINE) 25 MG tablet Take 1 tablet (25 mg total) by mouth 3 (three) times daily. 10/01/20   Debbrah Alar, NP  montelukast (SINGULAIR) 10 MG tablet  TAKE 1 TABLET BY MOUTH EVERYDAY AT BEDTIME 09/27/20   Debbrah Alar, NP  olopatadine (PATANOL) 0.1 % ophthalmic solution INSTILL 1 DROP INTO BOTH EYES TWICE A DAY 07/08/20   Debbrah Alar, NP    Allergies    Lisinopril and Penicillins  Review of Systems   Review of Systems  Constitutional:  Negative for chills and fever.  HENT:  Negative for ear pain and sore throat.   Eyes:  Negative for pain and visual disturbance.  Respiratory:  Negative for cough and shortness of breath.   Cardiovascular:  Negative for chest pain and palpitations.  Gastrointestinal:  Negative for abdominal pain and vomiting.  Genitourinary:  Negative for dysuria and hematuria.  Musculoskeletal:  Negative for arthralgias and back pain.  Skin:  Negative for color change and rash.   Neurological:  Negative for seizures, syncope and headaches.  All other systems reviewed and are negative.  Physical Exam Updated Vital Signs BP 136/88 (BP Location: Right Arm)   Pulse (!) 58   Temp 98.2 F (36.8 C) (Oral)   Resp 16   Ht '5\' 11"'$  (1.803 m)   Wt 74.8 kg   SpO2 100%   BMI 23.01 kg/m   Physical Exam Vitals and nursing note reviewed.  Constitutional:      Appearance: Normal appearance.  HENT:     Head: Normocephalic and atraumatic.  Eyes:     Conjunctiva/sclera: Conjunctivae normal.  Pulmonary:     Effort: Pulmonary effort is normal. No respiratory distress.  Musculoskeletal:        General: No deformity. Normal range of motion.     Cervical back: Normal range of motion.     Comments: Posterior elbow is mildly erythematous with slight bursal swelling and tenderness to palpation overlying this area.  Range of motion is normal.  The extremity is warm and well-perfused.  Skin:    General: Skin is warm and dry.  Neurological:     General: No focal deficit present.     Mental Status: He is alert and oriented to person, place, and time. Mental status is at baseline.  Psychiatric:        Mood and Affect: Mood normal.    ED Results / Procedures / Treatments   Labs (all labs ordered are listed, but only abnormal results are displayed) Labs Reviewed - No data to display  EKG None  Radiology DG Elbow Complete Left  Result Date: 10/09/2020 CLINICAL DATA:  Left elbow pain and swelling.  No reported injury. EXAM: LEFT ELBOW - COMPLETE 3+ VIEW COMPARISON:  None. FINDINGS: There is no evidence of fracture, dislocation, or joint effusion. There is no evidence of arthropathy or other focal bone abnormality. Soft tissues are unremarkable. IMPRESSION: Negative. Electronically Signed   By: Ilona Sorrel M.D.   On: 10/09/2020 10:04    Procedures Procedures   Medications Ordered in ED Medications - No data to display  ED Course  I have reviewed the triage vital signs  and the nursing notes.  Pertinent labs & imaging results that were available during my care of the patient were reviewed by me and considered in my medical decision making (see chart for details).    MDM Rules/Calculators/A&P                           Symptoms are consistent with a mild, septic bursitis of the left elbow.  He was advised on symptomatic management and given return precautions.  This is not consistent with septic arthritis. Final Clinical Impression(s) / ED Diagnoses Final diagnoses:  Septic olecranon bursitis of left elbow    Rx / DC Orders ED Discharge Orders          Ordered    doxycycline (VIBRAMYCIN) 100 MG capsule  2 times daily        10/09/20 1031             Arnaldo Natal, MD 10/09/20 1035

## 2020-10-12 DIAGNOSIS — J3089 Other allergic rhinitis: Secondary | ICD-10-CM | POA: Diagnosis not present

## 2020-10-12 DIAGNOSIS — J3081 Allergic rhinitis due to animal (cat) (dog) hair and dander: Secondary | ICD-10-CM | POA: Diagnosis not present

## 2020-10-12 DIAGNOSIS — J301 Allergic rhinitis due to pollen: Secondary | ICD-10-CM | POA: Diagnosis not present

## 2020-10-20 DIAGNOSIS — J3081 Allergic rhinitis due to animal (cat) (dog) hair and dander: Secondary | ICD-10-CM | POA: Diagnosis not present

## 2020-10-20 DIAGNOSIS — J3089 Other allergic rhinitis: Secondary | ICD-10-CM | POA: Diagnosis not present

## 2020-10-20 DIAGNOSIS — J301 Allergic rhinitis due to pollen: Secondary | ICD-10-CM | POA: Diagnosis not present

## 2020-10-22 ENCOUNTER — Ambulatory Visit: Payer: PPO | Admitting: Orthopedic Surgery

## 2020-10-22 ENCOUNTER — Other Ambulatory Visit: Payer: Self-pay

## 2020-10-22 ENCOUNTER — Ambulatory Visit: Payer: Self-pay

## 2020-10-22 DIAGNOSIS — M25522 Pain in left elbow: Secondary | ICD-10-CM

## 2020-10-24 ENCOUNTER — Encounter: Payer: Self-pay | Admitting: Orthopedic Surgery

## 2020-10-24 NOTE — Progress Notes (Signed)
Office Visit Note   Patient: Gregory Brennan           Date of Birth: 08/09/1945           MRN: [id number] Visit Date: 10/22/2020 Requested by: Debbrah Alar, NP [address] [place],  [place] [id number] PCP: Debbrah Alar, NP  Subjective: Chief Complaint  Patient presents with   Left Elbow - New Patient (Initial Visit)    HPI: Kitzmann is a 75 year old patient with left elbow pain.  Reports throbbing pain in the left elbow.  2 weeks ago it was sore and he was believed to have olecranon bursitis.  He had a course of doxycycline and is currently off antibiotics.  He is getting better.  He did have erythema expanding down to his forearm.  He has been retired since 2007.  Likes to go to the gym 3-4 times a week as well as kayak and bike 3-4 times a week.  Denies any current fevers or chills.              ROS: All systems reviewed are negative as they relate to the chief complaint within the history of present illness.  Patient denies  fevers or chills.   Assessment & Plan: Visit Diagnoses:  1. Pain in left elbow     Plan: Impression is olecranon bursitis left elbow which is improving.  By history it does seem like this was infectious olecranon bursitis.  Currently there is no real fluid collection in the bursa but it is somewhat boggy.  He is improving clinically.  I would favor return to near full activity over the next week so we can see where he is at in a week to determine whether or not we need to do any type of formal bursectomy.  He is going to [place] at the end of September and so he would like to be functional for that vacation.  Follow-up in 7 days with decision point at that time for or against olecranon bursectomy  Follow-Up Instructions: No follow-ups on file.   Orders:  No orders of the defined types were placed in this encounter.  No orders of the defined types were placed in this encounter.     Procedures: No procedures performed   Clinical  Data: No additional findings.  Objective: Vital Signs: There were no vitals taken for this visit.  Physical Exam:   Constitutional: Patient appears well-developed HEENT:  Head: Normocephalic Eyes:EOM are normal Neck: Normal range of motion Cardiovascular: Normal rate Pulmonary/chest: Effort normal Neurologic: Patient is alert Skin: Skin is warm Psychiatric: Patient has normal mood and affect   Ortho Exam: Ortho exam demonstrates full active and passive range of motion of the left elbow.  Slight boggy synovitis in the left olecranon bursa compared to the right.  No proximal lymphadenopathy on the left.  Motor sensory function to the hand is intact.  No induration extending around the olecranon bursa into the forearm region.  No discrete tenderness over the olecranon bursa.  Specialty Comments:  No specialty comments available.  Imaging: No results found.   PMFS History: Patient Active Problem List   Diagnosis Date Noted   Hyponatremia 03/09/2014   Hyperglycemia 03/02/2014   HTN (hypertension) 12/30/2013   History of rheumatic fever 12/30/2013   Environmental allergies 12/30/2013   Past Medical History:  Diagnosis Date   Allergy    Cancer ([place])    basal cell cancer- nose   Cataract    Heart  murmur    'Possibly gone away"- per pt   Hypertension    MRSA (methicillin resistant staph aureus) culture positive 2007   Rheumatic fever    age 9    Family History  Problem Relation Age of Onset   Emphysema Mother    Hypertension Father    Colon cancer Neg Hx    Colon polyps Neg Hx    Esophageal cancer Neg Hx    Rectal cancer Neg Hx    Stomach cancer Neg Hx     Past Surgical History:  Procedure Laterality Date   ABDOMINAL HERNIA REPAIR  12/31/2011   COLONOSCOPY     SKIN CANCER EXCISION     basal cell- nose   Social History   Occupational History   Not on file  Tobacco Use   Smoking status: Never   Smokeless tobacco: Never  Vaping Use   Vaping Use: Never  used  Substance and Sexual Activity   Alcohol use: No   Drug use: No   Sexual activity: Yes

## 2020-10-27 DIAGNOSIS — J301 Allergic rhinitis due to pollen: Secondary | ICD-10-CM | POA: Diagnosis not present

## 2020-10-27 DIAGNOSIS — J3089 Other allergic rhinitis: Secondary | ICD-10-CM | POA: Diagnosis not present

## 2020-10-27 DIAGNOSIS — J3081 Allergic rhinitis due to animal (cat) (dog) hair and dander: Secondary | ICD-10-CM | POA: Diagnosis not present

## 2020-11-03 ENCOUNTER — Other Ambulatory Visit: Payer: Self-pay

## 2020-11-03 ENCOUNTER — Ambulatory Visit: Payer: PPO | Admitting: Orthopedic Surgery

## 2020-11-03 DIAGNOSIS — J3089 Other allergic rhinitis: Secondary | ICD-10-CM | POA: Diagnosis not present

## 2020-11-03 DIAGNOSIS — M25522 Pain in left elbow: Secondary | ICD-10-CM

## 2020-11-03 DIAGNOSIS — J301 Allergic rhinitis due to pollen: Secondary | ICD-10-CM | POA: Diagnosis not present

## 2020-11-03 DIAGNOSIS — J3081 Allergic rhinitis due to animal (cat) (dog) hair and dander: Secondary | ICD-10-CM | POA: Diagnosis not present

## 2020-11-10 DIAGNOSIS — J3089 Other allergic rhinitis: Secondary | ICD-10-CM | POA: Diagnosis not present

## 2020-11-10 DIAGNOSIS — J301 Allergic rhinitis due to pollen: Secondary | ICD-10-CM | POA: Diagnosis not present

## 2020-11-10 DIAGNOSIS — J3081 Allergic rhinitis due to animal (cat) (dog) hair and dander: Secondary | ICD-10-CM | POA: Diagnosis not present

## 2020-11-11 ENCOUNTER — Encounter: Payer: Self-pay | Admitting: Orthopedic Surgery

## 2020-11-11 NOTE — Progress Notes (Signed)
Office Visit Note   Patient: Gregory Brennan           Date of Birth: 09-Feb-1946           MRN: [id number] Visit Date: 11/03/2020 Requested by: Debbrah Alar, NP [address] [place],  [place] [address] PCP: Debbrah Alar, NP  Subjective: Chief Complaint  Patient presents with   Left Elbow - Follow-up    Olecranon bursitis    HPI: Gregory Brennan is a 75 year old patient with left elbow olecranon bursitis.  Decision point today was for or against operative bursectomy.  Overall he is doing better.  He is off antibiotics.  He reports decreased redness decreased pain.  Still has mild tenderness but overall his elbow is significantly better than it was a week ago.              ROS: All systems reviewed are negative as they relate to the chief complaint within the history of present illness.  Patient denies  fevers or chills.   Assessment & Plan: Visit Diagnoses:  1. Pain in left elbow     Plan: Impression is resolved left elbow olecranon bursitis.  Plan is observation for now.  If he has any type of recurrent symptoms of erythema fluctuance or swelling I asked him to come back in so we can look at it.  Otherwise he will follow-up as needed.  Follow-Up Instructions: Return if symptoms worsen or fail to improve.   Orders:  No orders of the defined types were placed in this encounter.  No orders of the defined types were placed in this encounter.     Procedures: No procedures performed   Clinical Data: No additional findings.  Objective: Vital Signs: There were no vitals taken for this visit.  Physical Exam:   Constitutional: Patient appears well-developed HEENT:  Head: Normocephalic Eyes:EOM are normal Neck: Normal range of motion Cardiovascular: Normal rate Pulmonary/chest: Effort normal Neurologic: Patient is alert Skin: Skin is warm Psychiatric: Patient has normal mood and affect   Ortho Exam: Ortho exam demonstrates full active and passive  range of motion of the left elbow.  He has diminished swelling and no erythema or induration around the olecranon bursal region.  No proximal lymphadenopathy.  Minimal tenderness to direct palpation over the olecranon tip.  Overall significant improvement in the exam characteristics of the left elbow compared to prior examination a week ago.  Specialty Comments:  No specialty comments available.  Imaging: No results found.   PMFS History: Patient Active Problem List   Diagnosis Date Noted   Hyponatremia 03/09/2014   Hyperglycemia 03/02/2014   HTN (hypertension) 12/30/2013   History of rheumatic fever 12/30/2013   Environmental allergies 12/30/2013   Past Medical History:  Diagnosis Date   Allergy    Cancer ([facility])    basal cell cancer- nose   Cataract    Heart murmur    'Possibly gone away"- per pt   Hypertension    MRSA (methicillin resistant staph aureus) culture positive 2007   Rheumatic fever    age 39    Family History  Problem Relation Age of Onset   Emphysema Mother    Hypertension Father    Colon cancer Neg Hx    Colon polyps Neg Hx    Esophageal cancer Neg Hx    Rectal cancer Neg Hx    Stomach cancer Neg Hx     Past Surgical History:  Procedure Laterality Date   ABDOMINAL HERNIA REPAIR  12/31/2011   COLONOSCOPY     SKIN CANCER EXCISION     basal cell- nose   Social History   Occupational History   Not on file  Tobacco Use   Smoking status: Never   Smokeless tobacco: Never  Vaping Use   Vaping Use: Never used  Substance and Sexual Activity   Alcohol use: No   Drug use: No   Sexual activity: Yes

## 2020-11-17 DIAGNOSIS — J3081 Allergic rhinitis due to animal (cat) (dog) hair and dander: Secondary | ICD-10-CM | POA: Diagnosis not present

## 2020-11-17 DIAGNOSIS — J3089 Other allergic rhinitis: Secondary | ICD-10-CM | POA: Diagnosis not present

## 2020-11-17 DIAGNOSIS — J301 Allergic rhinitis due to pollen: Secondary | ICD-10-CM | POA: Diagnosis not present

## 2020-11-19 DIAGNOSIS — J3081 Allergic rhinitis due to animal (cat) (dog) hair and dander: Secondary | ICD-10-CM | POA: Diagnosis not present

## 2020-11-19 DIAGNOSIS — J3089 Other allergic rhinitis: Secondary | ICD-10-CM | POA: Diagnosis not present

## 2020-11-19 DIAGNOSIS — J301 Allergic rhinitis due to pollen: Secondary | ICD-10-CM | POA: Diagnosis not present

## 2020-11-26 DIAGNOSIS — J3089 Other allergic rhinitis: Secondary | ICD-10-CM | POA: Diagnosis not present

## 2020-11-26 DIAGNOSIS — J301 Allergic rhinitis due to pollen: Secondary | ICD-10-CM | POA: Diagnosis not present

## 2020-11-26 DIAGNOSIS — J3081 Allergic rhinitis due to animal (cat) (dog) hair and dander: Secondary | ICD-10-CM | POA: Diagnosis not present

## 2020-12-03 DIAGNOSIS — J301 Allergic rhinitis due to pollen: Secondary | ICD-10-CM | POA: Diagnosis not present

## 2020-12-03 DIAGNOSIS — J3089 Other allergic rhinitis: Secondary | ICD-10-CM | POA: Diagnosis not present

## 2020-12-03 DIAGNOSIS — J3081 Allergic rhinitis due to animal (cat) (dog) hair and dander: Secondary | ICD-10-CM | POA: Diagnosis not present

## 2020-12-08 DIAGNOSIS — J3081 Allergic rhinitis due to animal (cat) (dog) hair and dander: Secondary | ICD-10-CM | POA: Diagnosis not present

## 2020-12-08 DIAGNOSIS — J301 Allergic rhinitis due to pollen: Secondary | ICD-10-CM | POA: Diagnosis not present

## 2020-12-08 DIAGNOSIS — J3089 Other allergic rhinitis: Secondary | ICD-10-CM | POA: Diagnosis not present

## 2020-12-22 DIAGNOSIS — J3089 Other allergic rhinitis: Secondary | ICD-10-CM | POA: Diagnosis not present

## 2020-12-22 DIAGNOSIS — J3081 Allergic rhinitis due to animal (cat) (dog) hair and dander: Secondary | ICD-10-CM | POA: Diagnosis not present

## 2020-12-22 DIAGNOSIS — J301 Allergic rhinitis due to pollen: Secondary | ICD-10-CM | POA: Diagnosis not present

## 2020-12-23 ENCOUNTER — Other Ambulatory Visit: Payer: Self-pay | Admitting: Family

## 2020-12-30 DIAGNOSIS — J3081 Allergic rhinitis due to animal (cat) (dog) hair and dander: Secondary | ICD-10-CM | POA: Diagnosis not present

## 2020-12-30 DIAGNOSIS — J3089 Other allergic rhinitis: Secondary | ICD-10-CM | POA: Diagnosis not present

## 2020-12-30 DIAGNOSIS — J301 Allergic rhinitis due to pollen: Secondary | ICD-10-CM | POA: Diagnosis not present

## 2021-01-07 DIAGNOSIS — J301 Allergic rhinitis due to pollen: Secondary | ICD-10-CM | POA: Diagnosis not present

## 2021-01-07 DIAGNOSIS — J3089 Other allergic rhinitis: Secondary | ICD-10-CM | POA: Diagnosis not present

## 2021-01-07 DIAGNOSIS — J3081 Allergic rhinitis due to animal (cat) (dog) hair and dander: Secondary | ICD-10-CM | POA: Diagnosis not present

## 2021-01-14 ENCOUNTER — Other Ambulatory Visit: Payer: Self-pay | Admitting: Family

## 2021-01-14 DIAGNOSIS — J3081 Allergic rhinitis due to animal (cat) (dog) hair and dander: Secondary | ICD-10-CM | POA: Diagnosis not present

## 2021-01-14 DIAGNOSIS — J301 Allergic rhinitis due to pollen: Secondary | ICD-10-CM | POA: Diagnosis not present

## 2021-01-14 DIAGNOSIS — J3089 Other allergic rhinitis: Secondary | ICD-10-CM | POA: Diagnosis not present

## 2021-01-20 DIAGNOSIS — J3089 Other allergic rhinitis: Secondary | ICD-10-CM | POA: Diagnosis not present

## 2021-01-20 DIAGNOSIS — J3081 Allergic rhinitis due to animal (cat) (dog) hair and dander: Secondary | ICD-10-CM | POA: Diagnosis not present

## 2021-01-20 DIAGNOSIS — J301 Allergic rhinitis due to pollen: Secondary | ICD-10-CM | POA: Diagnosis not present

## 2021-01-28 DIAGNOSIS — J3081 Allergic rhinitis due to animal (cat) (dog) hair and dander: Secondary | ICD-10-CM | POA: Diagnosis not present

## 2021-01-28 DIAGNOSIS — J301 Allergic rhinitis due to pollen: Secondary | ICD-10-CM | POA: Diagnosis not present

## 2021-01-28 DIAGNOSIS — J3089 Other allergic rhinitis: Secondary | ICD-10-CM | POA: Diagnosis not present

## 2021-01-30 ENCOUNTER — Other Ambulatory Visit: Payer: Self-pay | Admitting: Family

## 2021-02-03 DIAGNOSIS — J301 Allergic rhinitis due to pollen: Secondary | ICD-10-CM | POA: Diagnosis not present

## 2021-02-03 DIAGNOSIS — J3089 Other allergic rhinitis: Secondary | ICD-10-CM | POA: Diagnosis not present

## 2021-02-03 DIAGNOSIS — J3081 Allergic rhinitis due to animal (cat) (dog) hair and dander: Secondary | ICD-10-CM | POA: Diagnosis not present

## 2021-02-08 DIAGNOSIS — J3081 Allergic rhinitis due to animal (cat) (dog) hair and dander: Secondary | ICD-10-CM | POA: Diagnosis not present

## 2021-02-08 DIAGNOSIS — J3089 Other allergic rhinitis: Secondary | ICD-10-CM | POA: Diagnosis not present

## 2021-02-08 DIAGNOSIS — J301 Allergic rhinitis due to pollen: Secondary | ICD-10-CM | POA: Diagnosis not present

## 2021-02-18 DIAGNOSIS — J3081 Allergic rhinitis due to animal (cat) (dog) hair and dander: Secondary | ICD-10-CM | POA: Diagnosis not present

## 2021-02-18 DIAGNOSIS — J301 Allergic rhinitis due to pollen: Secondary | ICD-10-CM | POA: Diagnosis not present

## 2021-02-18 DIAGNOSIS — J3089 Other allergic rhinitis: Secondary | ICD-10-CM | POA: Diagnosis not present

## 2021-02-25 DIAGNOSIS — J301 Allergic rhinitis due to pollen: Secondary | ICD-10-CM | POA: Diagnosis not present

## 2021-02-25 DIAGNOSIS — J3081 Allergic rhinitis due to animal (cat) (dog) hair and dander: Secondary | ICD-10-CM | POA: Diagnosis not present

## 2021-02-25 DIAGNOSIS — J3089 Other allergic rhinitis: Secondary | ICD-10-CM | POA: Diagnosis not present

## 2021-03-04 DIAGNOSIS — J3081 Allergic rhinitis due to animal (cat) (dog) hair and dander: Secondary | ICD-10-CM | POA: Diagnosis not present

## 2021-03-04 DIAGNOSIS — J3089 Other allergic rhinitis: Secondary | ICD-10-CM | POA: Diagnosis not present

## 2021-03-04 DIAGNOSIS — J301 Allergic rhinitis due to pollen: Secondary | ICD-10-CM | POA: Diagnosis not present

## 2021-03-16 DIAGNOSIS — J3089 Other allergic rhinitis: Secondary | ICD-10-CM | POA: Diagnosis not present

## 2021-03-16 DIAGNOSIS — J301 Allergic rhinitis due to pollen: Secondary | ICD-10-CM | POA: Diagnosis not present

## 2021-03-16 DIAGNOSIS — J3081 Allergic rhinitis due to animal (cat) (dog) hair and dander: Secondary | ICD-10-CM | POA: Diagnosis not present

## 2021-03-23 ENCOUNTER — Other Ambulatory Visit: Payer: Self-pay | Admitting: Family

## 2021-04-05 ENCOUNTER — Ambulatory Visit: Payer: PPO | Admitting: Family

## 2021-04-13 DIAGNOSIS — J3089 Other allergic rhinitis: Secondary | ICD-10-CM | POA: Diagnosis not present

## 2021-04-13 DIAGNOSIS — J3081 Allergic rhinitis due to animal (cat) (dog) hair and dander: Secondary | ICD-10-CM | POA: Diagnosis not present

## 2021-04-13 DIAGNOSIS — J301 Allergic rhinitis due to pollen: Secondary | ICD-10-CM | POA: Diagnosis not present

## 2021-04-18 ENCOUNTER — Ambulatory Visit (INDEPENDENT_AMBULATORY_CARE_PROVIDER_SITE_OTHER): Payer: PPO | Admitting: Family

## 2021-04-18 VITALS — BP 149/68 | HR 57 | Temp 98.4°F | Resp 16 | Wt 171.0 lb

## 2021-04-18 DIAGNOSIS — I10 Essential (primary) hypertension: Secondary | ICD-10-CM | POA: Diagnosis not present

## 2021-04-18 LAB — BASIC METABOLIC PANEL
BUN: 10 mg/dL (ref 6–23)
CO2: 29 mEq/L (ref 19–32)
Calcium: 9 mg/dL (ref 8.4–10.5)
Chloride: 97 mEq/L (ref 96–112)
Creatinine, Ser: 0.92 mg/dL (ref 0.40–1.50)
GFR: 82.33 mL/min (ref >60.00)
Glucose, Bld: 92 mg/dL (ref 70–99)
Potassium: 4.5 mEq/L (ref 3.5–5.1)
Sodium: 133 mEq/L — ABNORMAL LOW (ref 135–145)

## 2021-04-18 MED ORDER — AMLODIPINE BESYLATE 5 MG PO TABS: 5.0000 mg | ORAL_TABLET | Freq: Every day | ORAL | 1 refills | Status: DC

## 2021-04-18 NOTE — Assessment & Plan Note (Signed)
BP is elevated today in the clinic but he reports much better readings at home. He is advised to continue to monitor his home BP readings and let us know if he develops high readings at home.  Continue current meds.

## 2021-04-18 NOTE — Progress Notes (Signed)
Subjective:     Patient ID: Gregory Brennan, male    DOB: Jun 30, 1945, 76 y.o.   MRN: [id number]  Chief Complaint  Patient presents with   Hypertension    Here for follow up    HPI Patient is in today for follow up.  HTN- bp meds include amlodipine 5mg , hydralazine 25mg  tid and carvedilol 6.25mg  bid.  112-119/60-70 at home.     BP Readings from Last 3 Encounters:  04/18/21 (!) 149/68  10/09/20 136/88  10/01/20 130/88   Allergic Rhinitis- maintained on allegra, flonase, singulair. Continues to follow with the allergist.   Had Covid 6 weeks ago.  Doing fine now.    There are no preventive care reminders to display for this patient.  Past Medical History:  Diagnosis Date   Allergy    Cancer ([facility])    basal cell cancer- nose   Cataract    Heart murmur    'Possibly gone away"- per pt   Hypertension    MRSA (methicillin resistant staph aureus) culture positive 2007   Rheumatic fever    age 74    Past Surgical History:  Procedure Laterality Date   ABDOMINAL HERNIA REPAIR  12/31/2011   COLONOSCOPY     SKIN CANCER EXCISION     basal cell- nose    Family History  Problem Relation Age of Onset   Emphysema Mother    Hypertension Father    Colon cancer Neg Hx    Colon polyps Neg Hx    Esophageal cancer Neg Hx    Rectal cancer Neg Hx    Stomach cancer Neg Hx     Social History   Socioeconomic History   Marital status: Married    Spouse name: Not on file   Number of children: Not on file   Years of education: Not on file   Highest education level: Not on file  Occupational History   Not on file  Tobacco Use   Smoking status: Never   Smokeless tobacco: Never  Vaping Use   Vaping Use: Never used  Substance and Sexual Activity   Alcohol use: No   Drug use: No   Sexual activity: Yes  Other Topics Concern   Not on file  Social History Narrative   Moved from [place]    Daughter and son in law live in [place]   Has daughter in [place] articles/investing    Enjoys travelling   Worked as a Probation officer, 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 Use in each nostril as directed 30 mL 12   carvedilol (COREG) 6.25 MG tablet TAKE 1 TABLET BY MOUTH 2 TIMES Brennan WITH A MEAL. 180 tablet 1   fexofenadine (ALLEGRA) 180 MG tablet 1 tablet Swallow whole with water; do not take with fruit juices.     fluticasone (FLONASE) 50 MCG/ACT nasal spray Place 1 spray into both nostrils Brennan as needed.      hydrALAZINE (APRESOLINE) 25 MG tablet  Take 1 tablet (25 mg total) by mouth 3 (three) times Brennan. 270 tablet 1   montelukast (SINGULAIR) 10 MG tablet TAKE 1 TABLET BY MOUTH EVERYDAY AT BEDTIME 90 tablet 1   olopatadine (PATANOL) 0.1 % ophthalmic solution INSTILL 1 DROP INTO BOTH EYES TWICE A DAY 5 mL 5   amLODipine (NORVASC) 5 MG tablet TAKE 1 TABLET (5 MG TOTAL) BY MOUTH Brennan. 90 tablet 1   cetirizine (ZYRTEC) 10 MG tablet Take 10 mg by mouth Brennan.     doxycycline (VIBRAMYCIN) 100 MG capsule Take 1 capsule (100 mg total) by mouth 2 (two) times Brennan. 20 capsule 0   Facility-Administered Medications Prior to Visit  Medication Dose Route Frequency Provider Last Rate Last Admin   0.9 %  sodium chloride infusion  500 mL Intravenous Once Irene Shipper, MD        Allergies  Allergen Reactions   Lisinopril     Stopped working   Penicillins Rash    ROS See HPI    Objective:    Physical Exam Constitutional:      General: He is not in acute distress.    Appearance: He is well-developed.   HENT:     Head: Normocephalic and atraumatic.  Cardiovascular:     Rate and Rhythm: Normal rate and regular rhythm.     Heart sounds: No murmur heard. Pulmonary:     Effort: Pulmonary effort is normal. No respiratory distress.     Breath sounds: Normal breath sounds. No wheezing or rales.  Skin:    General: Skin is warm and dry.  Neurological:     Mental Status: He is alert and oriented to person, place, and time.  Psychiatric:        Behavior: Behavior normal.        Thought Content: Thought content normal.    BP (!) 149/68 (BP Location: Right Arm, Patient Position: Sitting, Cuff Size: Small)    Pulse (!) 57    Temp 98.4 F (36.9 C) (Oral)    Resp 16    Wt 171 lb (77.6 kg)    SpO2 100%    BMI 23.85 kg/m  Wt Readings from Last 3 Encounters:  04/18/21 171 lb (77.6 kg)  10/09/20 165 lb (74.8 kg)  10/01/20 166 lb 3.2 oz (75.4 kg)       Assessment & Plan:   Problem List Items Addressed This Visit       Unprioritized   HTN (hypertension) - Primary    BP is elevated today in the clinic but he reports much better readings at home. He is advised to continue to monitor his home BP readings and let us know if he develops high readings at home.  Continue current meds.       Relevant Medications   amLODipine (NORVASC) 5 MG tablet   Other Relevant Orders   Basic metabolic panel    I have discontinued Terrance [place]'s cetirizine and doxycycline. I am also having him maintain his fluticasone, azelastine, olopatadine, hydrALAZINE, carvedilol, montelukast, fexofenadine, and amLODipine. We will continue to administer sodium chloride.  Meds ordered this encounter  Medications   amLODipine (NORVASC) 5 MG tablet    Sig: Take 1 tablet (5 mg total) by mouth Brennan.    Dispense:  90 tablet    Refill:  1    Order Specific Question:   Supervising Provider    Answer:   Penni Homans A [[id number]]

## 2021-04-22 DIAGNOSIS — J3081 Allergic rhinitis due to animal (cat) (dog) hair and dander: Secondary | ICD-10-CM | POA: Diagnosis not present

## 2021-04-22 DIAGNOSIS — J3089 Other allergic rhinitis: Secondary | ICD-10-CM | POA: Diagnosis not present

## 2021-04-22 DIAGNOSIS — J301 Allergic rhinitis due to pollen: Secondary | ICD-10-CM | POA: Diagnosis not present

## 2021-04-29 DIAGNOSIS — J3089 Other allergic rhinitis: Secondary | ICD-10-CM | POA: Diagnosis not present

## 2021-04-29 DIAGNOSIS — J301 Allergic rhinitis due to pollen: Secondary | ICD-10-CM | POA: Diagnosis not present

## 2021-04-29 DIAGNOSIS — J3081 Allergic rhinitis due to animal (cat) (dog) hair and dander: Secondary | ICD-10-CM | POA: Diagnosis not present

## 2021-05-09 DIAGNOSIS — H1045 Other chronic allergic conjunctivitis: Secondary | ICD-10-CM | POA: Diagnosis not present

## 2021-05-09 DIAGNOSIS — J3081 Allergic rhinitis due to animal (cat) (dog) hair and dander: Secondary | ICD-10-CM | POA: Diagnosis not present

## 2021-05-09 DIAGNOSIS — J301 Allergic rhinitis due to pollen: Secondary | ICD-10-CM | POA: Diagnosis not present

## 2021-05-09 DIAGNOSIS — J3089 Other allergic rhinitis: Secondary | ICD-10-CM | POA: Diagnosis not present

## 2021-05-16 DIAGNOSIS — J3089 Other allergic rhinitis: Secondary | ICD-10-CM | POA: Diagnosis not present

## 2021-05-16 DIAGNOSIS — J3081 Allergic rhinitis due to animal (cat) (dog) hair and dander: Secondary | ICD-10-CM | POA: Diagnosis not present

## 2021-05-16 DIAGNOSIS — J301 Allergic rhinitis due to pollen: Secondary | ICD-10-CM | POA: Diagnosis not present

## 2021-05-20 ENCOUNTER — Other Ambulatory Visit: Payer: Self-pay | Admitting: Family

## 2021-05-25 DIAGNOSIS — J301 Allergic rhinitis due to pollen: Secondary | ICD-10-CM | POA: Diagnosis not present

## 2021-05-25 DIAGNOSIS — J3081 Allergic rhinitis due to animal (cat) (dog) hair and dander: Secondary | ICD-10-CM | POA: Diagnosis not present

## 2021-05-25 DIAGNOSIS — J3089 Other allergic rhinitis: Secondary | ICD-10-CM | POA: Diagnosis not present

## 2021-06-01 DIAGNOSIS — J3081 Allergic rhinitis due to animal (cat) (dog) hair and dander: Secondary | ICD-10-CM | POA: Diagnosis not present

## 2021-06-01 DIAGNOSIS — J3089 Other allergic rhinitis: Secondary | ICD-10-CM | POA: Diagnosis not present

## 2021-06-01 DIAGNOSIS — J301 Allergic rhinitis due to pollen: Secondary | ICD-10-CM | POA: Diagnosis not present

## 2021-06-11 DIAGNOSIS — J3089 Other allergic rhinitis: Secondary | ICD-10-CM | POA: Diagnosis not present

## 2021-06-11 DIAGNOSIS — J301 Allergic rhinitis due to pollen: Secondary | ICD-10-CM | POA: Diagnosis not present

## 2021-06-11 DIAGNOSIS — J3081 Allergic rhinitis due to animal (cat) (dog) hair and dander: Secondary | ICD-10-CM | POA: Diagnosis not present

## 2021-06-15 DIAGNOSIS — J3089 Other allergic rhinitis: Secondary | ICD-10-CM | POA: Diagnosis not present

## 2021-06-15 DIAGNOSIS — J3081 Allergic rhinitis due to animal (cat) (dog) hair and dander: Secondary | ICD-10-CM | POA: Diagnosis not present

## 2021-06-15 DIAGNOSIS — J301 Allergic rhinitis due to pollen: Secondary | ICD-10-CM | POA: Diagnosis not present

## 2021-06-26 ENCOUNTER — Encounter: Payer: Self-pay | Admitting: Family

## 2021-06-27 DIAGNOSIS — J301 Allergic rhinitis due to pollen: Secondary | ICD-10-CM | POA: Diagnosis not present

## 2021-06-27 DIAGNOSIS — J3089 Other allergic rhinitis: Secondary | ICD-10-CM | POA: Diagnosis not present

## 2021-06-27 DIAGNOSIS — J3081 Allergic rhinitis due to animal (cat) (dog) hair and dander: Secondary | ICD-10-CM | POA: Diagnosis not present

## 2021-06-27 MED ORDER — HYDRALAZINE HCL 25 MG PO TABS: 25.0000 mg | ORAL_TABLET | Freq: Three times a day (TID) | ORAL | 1 refills | Status: DC

## 2021-07-06 DIAGNOSIS — J3081 Allergic rhinitis due to animal (cat) (dog) hair and dander: Secondary | ICD-10-CM | POA: Diagnosis not present

## 2021-07-06 DIAGNOSIS — J301 Allergic rhinitis due to pollen: Secondary | ICD-10-CM | POA: Diagnosis not present

## 2021-07-06 DIAGNOSIS — J3089 Other allergic rhinitis: Secondary | ICD-10-CM | POA: Diagnosis not present

## 2021-07-15 DIAGNOSIS — J3081 Allergic rhinitis due to animal (cat) (dog) hair and dander: Secondary | ICD-10-CM | POA: Diagnosis not present

## 2021-07-15 DIAGNOSIS — J301 Allergic rhinitis due to pollen: Secondary | ICD-10-CM | POA: Diagnosis not present

## 2021-07-15 DIAGNOSIS — J3089 Other allergic rhinitis: Secondary | ICD-10-CM | POA: Diagnosis not present

## 2021-07-22 DIAGNOSIS — J3081 Allergic rhinitis due to animal (cat) (dog) hair and dander: Secondary | ICD-10-CM | POA: Diagnosis not present

## 2021-07-22 DIAGNOSIS — J301 Allergic rhinitis due to pollen: Secondary | ICD-10-CM | POA: Diagnosis not present

## 2021-07-22 DIAGNOSIS — J3089 Other allergic rhinitis: Secondary | ICD-10-CM | POA: Diagnosis not present

## 2021-07-24 ENCOUNTER — Other Ambulatory Visit: Payer: Self-pay | Admitting: Family

## 2021-07-28 DIAGNOSIS — J3081 Allergic rhinitis due to animal (cat) (dog) hair and dander: Secondary | ICD-10-CM | POA: Diagnosis not present

## 2021-07-28 DIAGNOSIS — J3089 Other allergic rhinitis: Secondary | ICD-10-CM | POA: Diagnosis not present

## 2021-07-28 DIAGNOSIS — J301 Allergic rhinitis due to pollen: Secondary | ICD-10-CM | POA: Diagnosis not present

## 2021-08-03 DIAGNOSIS — J301 Allergic rhinitis due to pollen: Secondary | ICD-10-CM | POA: Diagnosis not present

## 2021-08-03 DIAGNOSIS — J3089 Other allergic rhinitis: Secondary | ICD-10-CM | POA: Diagnosis not present

## 2021-08-03 DIAGNOSIS — J3081 Allergic rhinitis due to animal (cat) (dog) hair and dander: Secondary | ICD-10-CM | POA: Diagnosis not present

## 2021-08-10 DIAGNOSIS — J301 Allergic rhinitis due to pollen: Secondary | ICD-10-CM | POA: Diagnosis not present

## 2021-08-10 DIAGNOSIS — J3081 Allergic rhinitis due to animal (cat) (dog) hair and dander: Secondary | ICD-10-CM | POA: Diagnosis not present

## 2021-08-10 DIAGNOSIS — J3089 Other allergic rhinitis: Secondary | ICD-10-CM | POA: Diagnosis not present

## 2021-08-19 DIAGNOSIS — J301 Allergic rhinitis due to pollen: Secondary | ICD-10-CM | POA: Diagnosis not present

## 2021-08-19 DIAGNOSIS — J3081 Allergic rhinitis due to animal (cat) (dog) hair and dander: Secondary | ICD-10-CM | POA: Diagnosis not present

## 2021-08-19 DIAGNOSIS — J3089 Other allergic rhinitis: Secondary | ICD-10-CM | POA: Diagnosis not present

## 2021-08-24 DIAGNOSIS — J301 Allergic rhinitis due to pollen: Secondary | ICD-10-CM | POA: Diagnosis not present

## 2021-08-24 DIAGNOSIS — J3081 Allergic rhinitis due to animal (cat) (dog) hair and dander: Secondary | ICD-10-CM | POA: Diagnosis not present

## 2021-08-24 DIAGNOSIS — J3089 Other allergic rhinitis: Secondary | ICD-10-CM | POA: Diagnosis not present

## 2021-08-30 DIAGNOSIS — J301 Allergic rhinitis due to pollen: Secondary | ICD-10-CM | POA: Diagnosis not present

## 2021-08-30 DIAGNOSIS — J3081 Allergic rhinitis due to animal (cat) (dog) hair and dander: Secondary | ICD-10-CM | POA: Diagnosis not present

## 2021-08-30 DIAGNOSIS — J3089 Other allergic rhinitis: Secondary | ICD-10-CM | POA: Diagnosis not present

## 2021-09-06 DIAGNOSIS — J3089 Other allergic rhinitis: Secondary | ICD-10-CM | POA: Diagnosis not present

## 2021-09-06 DIAGNOSIS — J301 Allergic rhinitis due to pollen: Secondary | ICD-10-CM | POA: Diagnosis not present

## 2021-09-06 DIAGNOSIS — J3081 Allergic rhinitis due to animal (cat) (dog) hair and dander: Secondary | ICD-10-CM | POA: Diagnosis not present

## 2021-09-16 DIAGNOSIS — J3089 Other allergic rhinitis: Secondary | ICD-10-CM | POA: Diagnosis not present

## 2021-09-16 DIAGNOSIS — J301 Allergic rhinitis due to pollen: Secondary | ICD-10-CM | POA: Diagnosis not present

## 2021-09-16 DIAGNOSIS — J3081 Allergic rhinitis due to animal (cat) (dog) hair and dander: Secondary | ICD-10-CM | POA: Diagnosis not present

## 2021-09-22 ENCOUNTER — Other Ambulatory Visit: Payer: Self-pay | Admitting: Family

## 2021-09-23 DIAGNOSIS — J301 Allergic rhinitis due to pollen: Secondary | ICD-10-CM | POA: Diagnosis not present

## 2021-09-23 DIAGNOSIS — J3089 Other allergic rhinitis: Secondary | ICD-10-CM | POA: Diagnosis not present

## 2021-09-23 DIAGNOSIS — J3081 Allergic rhinitis due to animal (cat) (dog) hair and dander: Secondary | ICD-10-CM | POA: Diagnosis not present

## 2021-09-30 DIAGNOSIS — J301 Allergic rhinitis due to pollen: Secondary | ICD-10-CM | POA: Diagnosis not present

## 2021-09-30 DIAGNOSIS — J3081 Allergic rhinitis due to animal (cat) (dog) hair and dander: Secondary | ICD-10-CM | POA: Diagnosis not present

## 2021-09-30 DIAGNOSIS — J3089 Other allergic rhinitis: Secondary | ICD-10-CM | POA: Diagnosis not present

## 2021-10-06 DIAGNOSIS — J301 Allergic rhinitis due to pollen: Secondary | ICD-10-CM | POA: Diagnosis not present

## 2021-10-06 DIAGNOSIS — J3089 Other allergic rhinitis: Secondary | ICD-10-CM | POA: Diagnosis not present

## 2021-10-06 DIAGNOSIS — J3081 Allergic rhinitis due to animal (cat) (dog) hair and dander: Secondary | ICD-10-CM | POA: Diagnosis not present

## 2021-10-11 DIAGNOSIS — J301 Allergic rhinitis due to pollen: Secondary | ICD-10-CM | POA: Diagnosis not present

## 2021-10-11 DIAGNOSIS — J3081 Allergic rhinitis due to animal (cat) (dog) hair and dander: Secondary | ICD-10-CM | POA: Diagnosis not present

## 2021-10-11 DIAGNOSIS — J3089 Other allergic rhinitis: Secondary | ICD-10-CM | POA: Diagnosis not present

## 2021-10-14 ENCOUNTER — Other Ambulatory Visit: Payer: Self-pay | Admitting: Family

## 2021-10-21 DIAGNOSIS — J3081 Allergic rhinitis due to animal (cat) (dog) hair and dander: Secondary | ICD-10-CM | POA: Diagnosis not present

## 2021-10-21 DIAGNOSIS — J301 Allergic rhinitis due to pollen: Secondary | ICD-10-CM | POA: Diagnosis not present

## 2021-10-21 DIAGNOSIS — J3089 Other allergic rhinitis: Secondary | ICD-10-CM | POA: Diagnosis not present

## 2021-10-28 DIAGNOSIS — J301 Allergic rhinitis due to pollen: Secondary | ICD-10-CM | POA: Diagnosis not present

## 2021-10-28 DIAGNOSIS — J3089 Other allergic rhinitis: Secondary | ICD-10-CM | POA: Diagnosis not present

## 2021-10-28 DIAGNOSIS — J3081 Allergic rhinitis due to animal (cat) (dog) hair and dander: Secondary | ICD-10-CM | POA: Diagnosis not present

## 2021-11-03 ENCOUNTER — Encounter: Payer: Self-pay | Admitting: Family

## 2021-11-03 DIAGNOSIS — J3089 Other allergic rhinitis: Secondary | ICD-10-CM | POA: Diagnosis not present

## 2021-11-03 DIAGNOSIS — J3081 Allergic rhinitis due to animal (cat) (dog) hair and dander: Secondary | ICD-10-CM | POA: Diagnosis not present

## 2021-11-03 DIAGNOSIS — J301 Allergic rhinitis due to pollen: Secondary | ICD-10-CM | POA: Diagnosis not present

## 2021-11-04 NOTE — Telephone Encounter (Signed)
Tried calling Pt- no answer, unable to leave voicemail.

## 2021-11-11 ENCOUNTER — Telehealth: Payer: Self-pay | Admitting: Family

## 2021-11-11 DIAGNOSIS — J301 Allergic rhinitis due to pollen: Secondary | ICD-10-CM | POA: Diagnosis not present

## 2021-11-11 DIAGNOSIS — J3089 Other allergic rhinitis: Secondary | ICD-10-CM | POA: Diagnosis not present

## 2021-11-11 DIAGNOSIS — J3081 Allergic rhinitis due to animal (cat) (dog) hair and dander: Secondary | ICD-10-CM | POA: Diagnosis not present

## 2021-11-11 NOTE — Telephone Encounter (Signed)
Patient called to schedule annual wellness visit. Patient will call back as they are going to be out of town for a while.

## 2021-11-18 DIAGNOSIS — J301 Allergic rhinitis due to pollen: Secondary | ICD-10-CM | POA: Diagnosis not present

## 2021-11-18 DIAGNOSIS — J3089 Other allergic rhinitis: Secondary | ICD-10-CM | POA: Diagnosis not present

## 2021-11-18 DIAGNOSIS — J3081 Allergic rhinitis due to animal (cat) (dog) hair and dander: Secondary | ICD-10-CM | POA: Diagnosis not present

## 2021-11-23 DIAGNOSIS — J3089 Other allergic rhinitis: Secondary | ICD-10-CM | POA: Diagnosis not present

## 2021-11-23 DIAGNOSIS — J3081 Allergic rhinitis due to animal (cat) (dog) hair and dander: Secondary | ICD-10-CM | POA: Diagnosis not present

## 2021-11-23 DIAGNOSIS — J301 Allergic rhinitis due to pollen: Secondary | ICD-10-CM | POA: Diagnosis not present

## 2021-11-25 DIAGNOSIS — J301 Allergic rhinitis due to pollen: Secondary | ICD-10-CM | POA: Diagnosis not present

## 2021-11-25 DIAGNOSIS — J3089 Other allergic rhinitis: Secondary | ICD-10-CM | POA: Diagnosis not present

## 2021-11-25 DIAGNOSIS — J3081 Allergic rhinitis due to animal (cat) (dog) hair and dander: Secondary | ICD-10-CM | POA: Diagnosis not present

## 2021-11-30 DIAGNOSIS — J301 Allergic rhinitis due to pollen: Secondary | ICD-10-CM | POA: Diagnosis not present

## 2021-11-30 DIAGNOSIS — J3081 Allergic rhinitis due to animal (cat) (dog) hair and dander: Secondary | ICD-10-CM | POA: Diagnosis not present

## 2021-11-30 DIAGNOSIS — J3089 Other allergic rhinitis: Secondary | ICD-10-CM | POA: Diagnosis not present

## 2021-12-06 DIAGNOSIS — J3089 Other allergic rhinitis: Secondary | ICD-10-CM | POA: Diagnosis not present

## 2021-12-06 DIAGNOSIS — J301 Allergic rhinitis due to pollen: Secondary | ICD-10-CM | POA: Diagnosis not present

## 2021-12-06 DIAGNOSIS — J3081 Allergic rhinitis due to animal (cat) (dog) hair and dander: Secondary | ICD-10-CM | POA: Diagnosis not present

## 2021-12-16 DIAGNOSIS — J3089 Other allergic rhinitis: Secondary | ICD-10-CM | POA: Diagnosis not present

## 2021-12-16 DIAGNOSIS — J301 Allergic rhinitis due to pollen: Secondary | ICD-10-CM | POA: Diagnosis not present

## 2021-12-16 DIAGNOSIS — J3081 Allergic rhinitis due to animal (cat) (dog) hair and dander: Secondary | ICD-10-CM | POA: Diagnosis not present

## 2021-12-22 DIAGNOSIS — J3089 Other allergic rhinitis: Secondary | ICD-10-CM | POA: Diagnosis not present

## 2021-12-22 DIAGNOSIS — J301 Allergic rhinitis due to pollen: Secondary | ICD-10-CM | POA: Diagnosis not present

## 2021-12-22 DIAGNOSIS — J3081 Allergic rhinitis due to animal (cat) (dog) hair and dander: Secondary | ICD-10-CM | POA: Diagnosis not present

## 2021-12-29 DIAGNOSIS — J3089 Other allergic rhinitis: Secondary | ICD-10-CM | POA: Diagnosis not present

## 2021-12-29 DIAGNOSIS — J301 Allergic rhinitis due to pollen: Secondary | ICD-10-CM | POA: Diagnosis not present

## 2021-12-29 DIAGNOSIS — J3081 Allergic rhinitis due to animal (cat) (dog) hair and dander: Secondary | ICD-10-CM | POA: Diagnosis not present

## 2021-12-31 ENCOUNTER — Telehealth: Payer: Self-pay | Admitting: Family

## 2022-01-01 NOTE — Telephone Encounter (Signed)
Please contact pt to schedule follow up with me.  

## 2022-01-03 DIAGNOSIS — J3081 Allergic rhinitis due to animal (cat) (dog) hair and dander: Secondary | ICD-10-CM | POA: Diagnosis not present

## 2022-01-03 DIAGNOSIS — J301 Allergic rhinitis due to pollen: Secondary | ICD-10-CM | POA: Diagnosis not present

## 2022-01-03 DIAGNOSIS — J3089 Other allergic rhinitis: Secondary | ICD-10-CM | POA: Diagnosis not present

## 2022-01-03 NOTE — Telephone Encounter (Signed)
Lvm to sched.

## 2022-01-17 DIAGNOSIS — J3089 Other allergic rhinitis: Secondary | ICD-10-CM | POA: Diagnosis not present

## 2022-01-17 DIAGNOSIS — J3081 Allergic rhinitis due to animal (cat) (dog) hair and dander: Secondary | ICD-10-CM | POA: Diagnosis not present

## 2022-01-17 DIAGNOSIS — J301 Allergic rhinitis due to pollen: Secondary | ICD-10-CM | POA: Diagnosis not present

## 2022-01-18 ENCOUNTER — Other Ambulatory Visit: Payer: Self-pay | Admitting: Family

## 2022-01-24 DIAGNOSIS — J3081 Allergic rhinitis due to animal (cat) (dog) hair and dander: Secondary | ICD-10-CM | POA: Diagnosis not present

## 2022-01-24 DIAGNOSIS — J3089 Other allergic rhinitis: Secondary | ICD-10-CM | POA: Diagnosis not present

## 2022-01-24 DIAGNOSIS — J301 Allergic rhinitis due to pollen: Secondary | ICD-10-CM | POA: Diagnosis not present

## 2022-01-31 ENCOUNTER — Ambulatory Visit (INDEPENDENT_AMBULATORY_CARE_PROVIDER_SITE_OTHER): Payer: PPO | Admitting: Family

## 2022-01-31 VITALS — BP 139/73 | HR 55 | Temp 97.9°F | Resp 16 | Wt 169.0 lb

## 2022-01-31 DIAGNOSIS — E871 Hypo-osmolality and hyponatremia: Secondary | ICD-10-CM

## 2022-01-31 DIAGNOSIS — R739 Hyperglycemia, unspecified: Secondary | ICD-10-CM | POA: Diagnosis not present

## 2022-01-31 DIAGNOSIS — Z9109 Other allergy status, other than to drugs and biological substances: Secondary | ICD-10-CM

## 2022-01-31 DIAGNOSIS — I10 Essential (primary) hypertension: Secondary | ICD-10-CM | POA: Diagnosis not present

## 2022-01-31 LAB — BASIC METABOLIC PANEL
BUN: 9 mg/dL (ref 6–23)
CO2: 30 mEq/L (ref 19–32)
Calcium: 9.2 mg/dL (ref 8.4–10.5)
Chloride: 94 mEq/L — ABNORMAL LOW (ref 96–112)
Creatinine, Ser: 0.91 mg/dL (ref 0.40–1.50)
GFR: 82.95 mL/min (ref >60.00)
Glucose, Bld: 90 mg/dL (ref 70–99)
Potassium: 4.7 mEq/L (ref 3.5–5.1)
Sodium: 129 mEq/L — ABNORMAL LOW (ref 135–145)

## 2022-01-31 LAB — HEMOGLOBIN A1C: Hgb A1c MFr Bld: 5.5 % (ref 4.6–6.5)

## 2022-01-31 MED ORDER — MONTELUKAST SODIUM 10 MG PO TABS: ORAL_TABLET | ORAL | 1 refills | Status: DC

## 2022-01-31 MED ORDER — AZELASTINE HCL 0.1 % NA SOLN: 2.0000 | Freq: Two times a day (BID) | NASAL | 12 refills | Status: DC

## 2022-01-31 MED ORDER — HYDRALAZINE HCL 25 MG PO TABS: ORAL_TABLET | ORAL | 1 refills | Status: DC

## 2022-01-31 NOTE — Assessment & Plan Note (Signed)
Lab Results  Component Value Date   HGBA1C 5.6 10/24/2017

## 2022-01-31 NOTE — Progress Notes (Signed)
Subjective:   By signing my name below, I, Carylon Perches, attest that this documentation has been prepared under the direction and in the presence of Karie Chimera, NP 01/31/2022   Patient ID: Gregory Brennan, male    DOB: 1945/05/22, 76 y.o.   MRN: [id number]  No chief complaint on file.   HPI Patient is in today for an office visit  Refill: He is requesting a refill of 10 mg of Singulair, 0.1 % of Astelin, and 25 mg of Hydralazine.   Exercise: He reports that he plays pickleball about 3-4 times a week. He plays pickleball for about 2 1/2 - 3 hours.   Blood Pressure: His blood pressure during today's visit is normal. He regularly records his blood pressure at home. BP Readings from Last 3 Encounters:  01/31/22 139/73  04/18/21 (!) 149/68  10/09/20 136/88   Pulse Readings from Last 3 Encounters:  01/31/22 (!) 55  04/18/21 (!) 57  10/09/20 (!) 58   Allergies: He reports that his allergies fluctuate. He is currently taking 10 mg of Singulair, 50 mg/act of Flonase, and 0.1% of Astelin  Immunizations: He is UTD on his Covid and Influenza vaccine.   Health Maintenance Due  Topic Date Due   Medicare Annual Wellness (AWV)  05/02/2017    Past Medical History:  Diagnosis Date   Allergy    Cancer ([facility])    basal cell cancer- nose   Cataract    Heart murmur    'Possibly gone away"- per pt   Hypertension    MRSA (methicillin resistant staph aureus) culture positive 2007   Rheumatic fever    age 89    Past Surgical History:  Procedure Laterality Date   ABDOMINAL HERNIA REPAIR  12/31/2011   COLONOSCOPY     SKIN CANCER EXCISION     basal cell- nose    Family History  Problem Relation Age of Onset   Emphysema Mother    Hypertension Father    Colon cancer Neg Hx    Colon polyps Neg Hx    Esophageal cancer Neg Hx    Rectal cancer Neg Hx    Stomach cancer Neg Hx     Social History   Socioeconomic History   Marital status: Married    Spouse name: Not on file    Number of children: Not on file   Years of education: Not on file   Highest education level: Not on file  Occupational History   Not on file  Tobacco Use   Smoking status: Never   Smokeless tobacco: Never  Vaping Use   Vaping Use: Never used  Substance and Sexual Activity   Alcohol use: No   Drug use: No   Sexual activity: Yes  Other Topics Concern   Not on file  Social History Narrative   Moved from [place]    Daughter and son in law live in [place]   Has daughter in [place] articles/investing   Enjoys travelling   Worked as a Probation officer, 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 90 tablet 1   carvedilol (COREG) 6.25 MG tablet TAKE 1 TABLET BY MOUTH TWICE A DAY WITH MEALS 180 tablet 1   fexofenadine (ALLEGRA) 180 MG tablet 1 tablet Swallow whole with water; do not take with fruit juices.     fluticasone (FLONASE) 50 MCG/ACT nasal spray Place 1 spray into both nostrils daily as needed.      olopatadine (PATANOL) 0.1 % ophthalmic solution INSTILL 1 DROP INTO BOTH EYES TWICE A DAY 5 mL 5   azelastine (ASTELIN) 0.1 % nasal spray Place 2 sprays into both nostrils 2 (two) times daily. Use in each nostril as directed 30 mL 12   hydrALAZINE (APRESOLINE) 25 MG tablet TAKE 1 TABLET (25 MG TOTAL) BY MOUTH 4 (FOUR) TIMES DAILY. 360 tablet 1   montelukast (SINGULAIR) 10 MG tablet TAKE 1 TABLET BY MOUTH EVERYDAY AT BEDTIME 90 tablet 1   Facility-Administered Medications Prior to Visit  Medication Dose Route Frequency Provider Last Rate Last  Admin   0.9 %  sodium chloride infusion  500 mL Intravenous Once Irene Shipper, MD        Allergies  Allergen Reactions   Lisinopril     Stopped working   Penicillins Rash    ROS See HPI    Objective:    Physical Exam Constitutional:      General: He is not in acute distress.    Appearance: Normal appearance. He is not ill-appearing.  HENT:     Head: Normocephalic and atraumatic.     Right Ear: External ear normal.     Left Ear: External ear normal.  Eyes:     Extraocular Movements: Extraocular movements intact.     Pupils: Pupils are equal, round, and reactive to light.  Cardiovascular:     Rate and Rhythm: Normal rate and regular rhythm.     Heart sounds: Normal heart sounds. No murmur heard.    No gallop.  Pulmonary:     Effort: Pulmonary effort is normal. No respiratory distress.     Breath sounds: Normal breath sounds. No wheezing or rales.  Skin:    General: Skin is warm and dry.  Neurological:     Mental Status: He is alert and oriented to person, place, and time.  Psychiatric:        Mood and Affect: Mood normal.        Behavior: Behavior normal.        Judgment: Judgment normal.     BP 139/73 (BP Location: Right Arm, Patient Position: Sitting, Cuff Size: Small)   Pulse (!) 55   Temp 97.9 F (36.6 C) (Oral)   Resp 16   Wt 169 lb (76.7 kg)   SpO2 100%   BMI 23.57 kg/m  Wt Readings from Last 3 Encounters:  01/31/22 169 lb (76.7 kg)  04/18/21 171 lb (77.6 kg)  10/09/20 165 lb (74.8 kg)       Assessment & Plan:   Problem List Items Addressed This Visit       Unprioritized   Hyponatremia   Relevant Orders   Basic metabolic panel   Hyperglycemia    Lab Results  Component Value Date   HGBA1C 5.6 10/24/2017        Relevant Orders   Hemoglobin A1c   HTN (hypertension) - Primary    BP Readings from Last 3 Encounters:  01/31/22 139/73  04/18/21 (!) 149/68  10/09/20 136/88  BP stable on amlodipine '5mg'$  once daily and  carvedilol/apresoline. Continue same.       Relevant Medications   hydrALAZINE (APRESOLINE) 25 MG tablet   Other Relevant Orders   Basic metabolic panel   Environmental allergies    Stable on allegra/singulair/astelin/flonase. Continue same.       Meds ordered this encounter  Medications   hydrALAZINE (APRESOLINE) 25 MG tablet    Sig: TAKE 1 TABLET (25 MG TOTAL) BY MOUTH 4 (FOUR) TIMES DAILY.    Dispense:  360 tablet    Refill:  1    Order Specific Question:   Supervising Provider    Answer:   Penni Homans A [[id number]]   montelukast (SINGULAIR) 10 MG tablet    Sig: TAKE 1 TABLET BY MOUTH EVERYDAY AT BEDTIME    Dispense:  90 tablet    Refill:  1    Order Specific Question:   Supervising Provider    Answer:   Penni Homans A [[id number]]   azelastine (ASTELIN) 0.1 % nasal spray    Sig: Place 2 sprays into both nostrils 2 (two) times daily. Use in each nostril as directed    Dispense:  30 mL    Refill:  12    Order Specific Question:   Supervising Provider    Answer:   Penni Homans A [[id number]]    I, Nance Pear, NP, personally preformed the services described in this documentation.  All medical record entries made by the scribe were at my direction and in my presence.  I have reviewed the chart and discharge instructions (if applicable) and agree that the record reflects my personal performance and is accurate and complete. 01/31/2022   I,Amber Collins,acting as a scribe for Nance Pear, NP.,have documented all relevant documentation on the behalf of Nance Pear, NP,as directed by  Nance Pear, NP while in the presence of Nance Pear, NP.    Nance Pear, NP

## 2022-01-31 NOTE — Assessment & Plan Note (Signed)
BP Readings from Last 3 Encounters:  01/31/22 139/73  04/18/21 (!) 149/68  10/09/20 136/88   BP stable on amlodipine '5mg'$  once daily and carvedilol/apresoline. Continue same.

## 2022-01-31 NOTE — Assessment & Plan Note (Signed)
Stable on allegra/singulair/astelin/flonase. Continue same.

## 2022-02-02 DIAGNOSIS — J301 Allergic rhinitis due to pollen: Secondary | ICD-10-CM | POA: Diagnosis not present

## 2022-02-02 DIAGNOSIS — J3089 Other allergic rhinitis: Secondary | ICD-10-CM | POA: Diagnosis not present

## 2022-02-02 DIAGNOSIS — J3081 Allergic rhinitis due to animal (cat) (dog) hair and dander: Secondary | ICD-10-CM | POA: Diagnosis not present

## 2022-02-07 DIAGNOSIS — J3081 Allergic rhinitis due to animal (cat) (dog) hair and dander: Secondary | ICD-10-CM | POA: Diagnosis not present

## 2022-02-07 DIAGNOSIS — J301 Allergic rhinitis due to pollen: Secondary | ICD-10-CM | POA: Diagnosis not present

## 2022-02-07 DIAGNOSIS — J3089 Other allergic rhinitis: Secondary | ICD-10-CM | POA: Diagnosis not present

## 2022-02-22 DIAGNOSIS — J301 Allergic rhinitis due to pollen: Secondary | ICD-10-CM | POA: Diagnosis not present

## 2022-02-22 DIAGNOSIS — J3081 Allergic rhinitis due to animal (cat) (dog) hair and dander: Secondary | ICD-10-CM | POA: Diagnosis not present

## 2022-02-22 DIAGNOSIS — J3089 Other allergic rhinitis: Secondary | ICD-10-CM | POA: Diagnosis not present

## 2022-03-03 DIAGNOSIS — J3089 Other allergic rhinitis: Secondary | ICD-10-CM | POA: Diagnosis not present

## 2022-03-03 DIAGNOSIS — J301 Allergic rhinitis due to pollen: Secondary | ICD-10-CM | POA: Diagnosis not present

## 2022-03-03 DIAGNOSIS — J3081 Allergic rhinitis due to animal (cat) (dog) hair and dander: Secondary | ICD-10-CM | POA: Diagnosis not present

## 2022-03-10 DIAGNOSIS — J3081 Allergic rhinitis due to animal (cat) (dog) hair and dander: Secondary | ICD-10-CM | POA: Diagnosis not present

## 2022-03-10 DIAGNOSIS — J3089 Other allergic rhinitis: Secondary | ICD-10-CM | POA: Diagnosis not present

## 2022-03-10 DIAGNOSIS — J301 Allergic rhinitis due to pollen: Secondary | ICD-10-CM | POA: Diagnosis not present

## 2022-03-15 ENCOUNTER — Encounter: Payer: Self-pay | Admitting: Family

## 2022-03-17 DIAGNOSIS — J301 Allergic rhinitis due to pollen: Secondary | ICD-10-CM | POA: Diagnosis not present

## 2022-03-17 DIAGNOSIS — J3081 Allergic rhinitis due to animal (cat) (dog) hair and dander: Secondary | ICD-10-CM | POA: Diagnosis not present

## 2022-03-17 DIAGNOSIS — J3089 Other allergic rhinitis: Secondary | ICD-10-CM | POA: Diagnosis not present

## 2022-03-24 DIAGNOSIS — J3081 Allergic rhinitis due to animal (cat) (dog) hair and dander: Secondary | ICD-10-CM | POA: Diagnosis not present

## 2022-03-24 DIAGNOSIS — J3089 Other allergic rhinitis: Secondary | ICD-10-CM | POA: Diagnosis not present

## 2022-03-24 DIAGNOSIS — J301 Allergic rhinitis due to pollen: Secondary | ICD-10-CM | POA: Diagnosis not present

## 2022-03-29 ENCOUNTER — Telehealth: Payer: Self-pay | Admitting: *Deleted

## 2022-03-29 NOTE — Telephone Encounter (Signed)
LMOM for pt to schedule AWV. 

## 2022-03-31 DIAGNOSIS — J3081 Allergic rhinitis due to animal (cat) (dog) hair and dander: Secondary | ICD-10-CM | POA: Diagnosis not present

## 2022-03-31 DIAGNOSIS — J301 Allergic rhinitis due to pollen: Secondary | ICD-10-CM | POA: Diagnosis not present

## 2022-03-31 DIAGNOSIS — J3089 Other allergic rhinitis: Secondary | ICD-10-CM | POA: Diagnosis not present

## 2022-04-06 ENCOUNTER — Telehealth: Payer: Self-pay | Admitting: Family

## 2022-04-06 NOTE — Telephone Encounter (Signed)
Copied from [place] [id number]. Topic: Medicare AWV >> Apr 06, 2022 11:59 AM Gillis Santa wrote: Reason for CRM: LVM PATIENT TO CALL [phone] TO SCHEDULE AWVS [place]

## 2022-04-10 DIAGNOSIS — J301 Allergic rhinitis due to pollen: Secondary | ICD-10-CM | POA: Diagnosis not present

## 2022-04-10 DIAGNOSIS — J3089 Other allergic rhinitis: Secondary | ICD-10-CM | POA: Diagnosis not present

## 2022-04-10 DIAGNOSIS — J3081 Allergic rhinitis due to animal (cat) (dog) hair and dander: Secondary | ICD-10-CM | POA: Diagnosis not present

## 2022-04-17 DIAGNOSIS — J3081 Allergic rhinitis due to animal (cat) (dog) hair and dander: Secondary | ICD-10-CM | POA: Diagnosis not present

## 2022-04-17 DIAGNOSIS — J3089 Other allergic rhinitis: Secondary | ICD-10-CM | POA: Diagnosis not present

## 2022-04-17 DIAGNOSIS — J301 Allergic rhinitis due to pollen: Secondary | ICD-10-CM | POA: Diagnosis not present

## 2022-04-28 DIAGNOSIS — J301 Allergic rhinitis due to pollen: Secondary | ICD-10-CM | POA: Diagnosis not present

## 2022-04-28 DIAGNOSIS — J3081 Allergic rhinitis due to animal (cat) (dog) hair and dander: Secondary | ICD-10-CM | POA: Diagnosis not present

## 2022-04-28 DIAGNOSIS — J3089 Other allergic rhinitis: Secondary | ICD-10-CM | POA: Diagnosis not present

## 2022-05-08 DIAGNOSIS — J3089 Other allergic rhinitis: Secondary | ICD-10-CM | POA: Diagnosis not present

## 2022-05-08 DIAGNOSIS — J301 Allergic rhinitis due to pollen: Secondary | ICD-10-CM | POA: Diagnosis not present

## 2022-05-08 DIAGNOSIS — J3081 Allergic rhinitis due to animal (cat) (dog) hair and dander: Secondary | ICD-10-CM | POA: Diagnosis not present

## 2022-05-08 DIAGNOSIS — H1045 Other chronic allergic conjunctivitis: Secondary | ICD-10-CM | POA: Diagnosis not present

## 2022-05-23 DIAGNOSIS — J3081 Allergic rhinitis due to animal (cat) (dog) hair and dander: Secondary | ICD-10-CM | POA: Diagnosis not present

## 2022-05-23 DIAGNOSIS — J301 Allergic rhinitis due to pollen: Secondary | ICD-10-CM | POA: Diagnosis not present

## 2022-05-23 DIAGNOSIS — J3089 Other allergic rhinitis: Secondary | ICD-10-CM | POA: Diagnosis not present

## 2022-06-09 DIAGNOSIS — J3089 Other allergic rhinitis: Secondary | ICD-10-CM | POA: Diagnosis not present

## 2022-06-09 DIAGNOSIS — J301 Allergic rhinitis due to pollen: Secondary | ICD-10-CM | POA: Diagnosis not present

## 2022-06-09 DIAGNOSIS — J3081 Allergic rhinitis due to animal (cat) (dog) hair and dander: Secondary | ICD-10-CM | POA: Diagnosis not present

## 2022-06-19 DIAGNOSIS — J3089 Other allergic rhinitis: Secondary | ICD-10-CM | POA: Diagnosis not present

## 2022-06-19 DIAGNOSIS — J3081 Allergic rhinitis due to animal (cat) (dog) hair and dander: Secondary | ICD-10-CM | POA: Diagnosis not present

## 2022-06-19 DIAGNOSIS — J301 Allergic rhinitis due to pollen: Secondary | ICD-10-CM | POA: Diagnosis not present

## 2022-06-25 ENCOUNTER — Other Ambulatory Visit: Payer: Self-pay | Admitting: Family

## 2022-06-26 ENCOUNTER — Other Ambulatory Visit: Payer: Self-pay | Admitting: Family

## 2022-06-27 DIAGNOSIS — J3081 Allergic rhinitis due to animal (cat) (dog) hair and dander: Secondary | ICD-10-CM | POA: Diagnosis not present

## 2022-06-27 DIAGNOSIS — J301 Allergic rhinitis due to pollen: Secondary | ICD-10-CM | POA: Diagnosis not present

## 2022-06-27 DIAGNOSIS — J3089 Other allergic rhinitis: Secondary | ICD-10-CM | POA: Diagnosis not present

## 2022-07-04 DIAGNOSIS — J301 Allergic rhinitis due to pollen: Secondary | ICD-10-CM | POA: Diagnosis not present

## 2022-07-04 DIAGNOSIS — J3089 Other allergic rhinitis: Secondary | ICD-10-CM | POA: Diagnosis not present

## 2022-07-04 DIAGNOSIS — J3081 Allergic rhinitis due to animal (cat) (dog) hair and dander: Secondary | ICD-10-CM | POA: Diagnosis not present

## 2022-07-11 DIAGNOSIS — J301 Allergic rhinitis due to pollen: Secondary | ICD-10-CM | POA: Diagnosis not present

## 2022-07-11 DIAGNOSIS — J3089 Other allergic rhinitis: Secondary | ICD-10-CM | POA: Diagnosis not present

## 2022-07-11 DIAGNOSIS — J3081 Allergic rhinitis due to animal (cat) (dog) hair and dander: Secondary | ICD-10-CM | POA: Diagnosis not present

## 2022-07-24 DIAGNOSIS — J301 Allergic rhinitis due to pollen: Secondary | ICD-10-CM | POA: Diagnosis not present

## 2022-07-24 DIAGNOSIS — J3081 Allergic rhinitis due to animal (cat) (dog) hair and dander: Secondary | ICD-10-CM | POA: Diagnosis not present

## 2022-07-24 DIAGNOSIS — J3089 Other allergic rhinitis: Secondary | ICD-10-CM | POA: Diagnosis not present

## 2022-08-03 DIAGNOSIS — J3089 Other allergic rhinitis: Secondary | ICD-10-CM | POA: Diagnosis not present

## 2022-08-03 DIAGNOSIS — J301 Allergic rhinitis due to pollen: Secondary | ICD-10-CM | POA: Diagnosis not present

## 2022-08-03 DIAGNOSIS — J3081 Allergic rhinitis due to animal (cat) (dog) hair and dander: Secondary | ICD-10-CM | POA: Diagnosis not present

## 2022-08-11 DIAGNOSIS — J3089 Other allergic rhinitis: Secondary | ICD-10-CM | POA: Diagnosis not present

## 2022-08-11 DIAGNOSIS — J301 Allergic rhinitis due to pollen: Secondary | ICD-10-CM | POA: Diagnosis not present

## 2022-08-11 DIAGNOSIS — J3081 Allergic rhinitis due to animal (cat) (dog) hair and dander: Secondary | ICD-10-CM | POA: Diagnosis not present

## 2022-08-18 DIAGNOSIS — J3081 Allergic rhinitis due to animal (cat) (dog) hair and dander: Secondary | ICD-10-CM | POA: Diagnosis not present

## 2022-08-18 DIAGNOSIS — J3089 Other allergic rhinitis: Secondary | ICD-10-CM | POA: Diagnosis not present

## 2022-08-18 DIAGNOSIS — J301 Allergic rhinitis due to pollen: Secondary | ICD-10-CM | POA: Diagnosis not present

## 2022-08-24 DIAGNOSIS — J3081 Allergic rhinitis due to animal (cat) (dog) hair and dander: Secondary | ICD-10-CM | POA: Diagnosis not present

## 2022-08-24 DIAGNOSIS — J301 Allergic rhinitis due to pollen: Secondary | ICD-10-CM | POA: Diagnosis not present

## 2022-08-24 DIAGNOSIS — J3089 Other allergic rhinitis: Secondary | ICD-10-CM | POA: Diagnosis not present

## 2022-09-01 DIAGNOSIS — J3089 Other allergic rhinitis: Secondary | ICD-10-CM | POA: Diagnosis not present

## 2022-09-01 DIAGNOSIS — J301 Allergic rhinitis due to pollen: Secondary | ICD-10-CM | POA: Diagnosis not present

## 2022-09-08 ENCOUNTER — Encounter: Payer: Self-pay | Admitting: Family

## 2022-09-08 DIAGNOSIS — J301 Allergic rhinitis due to pollen: Secondary | ICD-10-CM | POA: Diagnosis not present

## 2022-09-08 DIAGNOSIS — J3089 Other allergic rhinitis: Secondary | ICD-10-CM | POA: Diagnosis not present

## 2022-09-08 DIAGNOSIS — J3081 Allergic rhinitis due to animal (cat) (dog) hair and dander: Secondary | ICD-10-CM | POA: Diagnosis not present

## 2022-09-15 ENCOUNTER — Ambulatory Visit (INDEPENDENT_AMBULATORY_CARE_PROVIDER_SITE_OTHER): Payer: PPO | Admitting: Family

## 2022-09-15 VITALS — BP 128/62 | HR 54 | Temp 98.0°F | Resp 16 | Wt 168.0 lb

## 2022-09-15 DIAGNOSIS — I10 Essential (primary) hypertension: Secondary | ICD-10-CM

## 2022-09-15 DIAGNOSIS — J301 Allergic rhinitis due to pollen: Secondary | ICD-10-CM | POA: Diagnosis not present

## 2022-09-15 DIAGNOSIS — J3081 Allergic rhinitis due to animal (cat) (dog) hair and dander: Secondary | ICD-10-CM | POA: Diagnosis not present

## 2022-09-15 DIAGNOSIS — R21 Rash and other nonspecific skin eruption: Secondary | ICD-10-CM | POA: Diagnosis not present

## 2022-09-15 DIAGNOSIS — J3089 Other allergic rhinitis: Secondary | ICD-10-CM | POA: Diagnosis not present

## 2022-09-15 DIAGNOSIS — Z9109 Other allergy status, other than to drugs and biological substances: Secondary | ICD-10-CM

## 2022-09-15 MED ORDER — MONTELUKAST SODIUM 10 MG PO TABS: 10.0000 mg | ORAL_TABLET | Freq: Every day | ORAL | 0 refills | Status: DC

## 2022-09-15 MED ORDER — TERBINAFINE HCL 250 MG PO TABS: 250.0000 mg | ORAL_TABLET | Freq: Every day | ORAL | 0 refills | Status: AC

## 2022-09-15 MED ORDER — CARVEDILOL 6.25 MG PO TABS: 6.2500 mg | ORAL_TABLET | Freq: Two times a day (BID) | ORAL | 0 refills | Status: DC

## 2022-09-15 MED ORDER — HYDRALAZINE HCL 25 MG PO TABS: 25.0000 mg | ORAL_TABLET | Freq: Three times a day (TID) | ORAL | 0 refills | Status: DC

## 2022-09-15 NOTE — Assessment & Plan Note (Signed)
Stable on current regimen of amlodipine, carvedilol, and hydralazine (taken three times daily despite prescription for four times daily). -Continue current regimen. -Update hydralazine prescription to reflect actual usage.

## 2022-09-15 NOTE — Patient Instructions (Signed)
  During your visit, we discussed your skin lesions, your wife's dementia, and your current medications for blood pressure and allergies. We also talked about your general health maintenance.  YOUR PLAN:  -SKIN LESIONS: You have skin lesions that might be a fungal infection like ringworm. We're starting you on a medication called Lamisil for 7 days. If there's no improvement in 2 weeks, we'll refer you to a dermatologist.  -BLOOD PRESSURE: Your blood pressure is stable with your current medications. We'll continue with the same regimen and adjust the hydralazine prescription to match your actual usage.  -ALLERGIES: Your allergies are being managed well with daily Allegra and allergy shots. We'll continue with this regimen.  -GENERAL HEALTH MAINTENANCE: We'll check your sodium and kidney function today. We'll also refill your hydralazine and carvedilol prescriptions. We plan to follow up in six months.  INSTRUCTIONS:  Please start taking Lamisil once daily for 7 days for your skin lesions. If there's no improvement in 2 weeks, contact us for a referral to a dermatologist. Continue taking your current medications for blood pressure and allergies. We'll update your hydralazine prescription to reflect your actual usage. We'll also check your sodium and kidney function today and refill your hydralazine and carvedilol prescriptions. We plan to follow up in six months.

## 2022-09-15 NOTE — Progress Notes (Signed)
Subjective:     Patient ID: Gregory Brennan, male    DOB: Apr 17, 1945, 77 y.o.   MRN: [id number]  Chief Complaint  Patient presents with   Urticaria    Patient complains of lesions over his legs and arm      Discussed the use of AI scribe software for clinical note transcription with the patient, who gave verbal consent to proceed.  History of Present Illness   The patient presents with a seven week history of skin lesions. He describes the lesions as starting small and then enlarging and scabbing over a period of four to six weeks. The lesions are not itchy and are located on the hands and feet. The patient denies any similar past skin conditions, except for a penicillin allergy rash many years ago.  He has been using otc hydrocortisone cream without significant improvement.  He is active and uses workout mats at several gyms. He has a history of basal cell carcinoma and a precancerous lesion, both of which were removed by a dermatologist.  The patient also reports that his wife has dementia and is becoming less active and more forgetful. He is considering moving to a retirement community, but is concerned about the cost and the impact on his wife's quality of life. He is also considering hiring in-home help.  The patient's blood pressure is well controlled on amlodipine, carvedilol, and hydralazine. He takes hydralazine three times a day, despite the prescription indicating four times a day. He also has allergies, which are managed with daily Allegra and allergy shots.          Health Maintenance Due  Topic Date Due   Medicare Annual Wellness (AWV)  05/02/2017   COVID-19 Vaccine (8 - 2023-24 season) 03/07/2022    Past Medical History:  Diagnosis Date   Allergy    Cancer (HCC)    basal cell cancer- nose   Cataract    Heart murmur    'Possibly gone away"- per pt   Hypertension    MRSA (methicillin resistant staph aureus) culture positive 2007   Rheumatic fever    age 60     Past Surgical History:  Procedure Laterality Date   ABDOMINAL HERNIA REPAIR  12/31/2011   COLONOSCOPY     SKIN CANCER EXCISION     basal cell- nose    Family History  Problem Relation Age of Onset   Emphysema Mother    Hypertension Father    Colon cancer Neg Hx    Colon polyps Neg Hx    Esophageal cancer Neg Hx    Rectal cancer Neg Hx    Stomach cancer Neg Hx     Social History   Socioeconomic History   Marital status: Married    Spouse name: Not on file   Number of children: Not on file   Years of education: Not on file   Highest education level: Master's degree (e.g., MA, MS, MEng, MEd, MSW, MBA)  Occupational History   Not on file  Tobacco Use   Smoking status: Never   Smokeless tobacco: Never  Vaping Use   Vaping Use: Never used  Substance and Sexual Activity   Alcohol use: No   Drug use: No   Sexual activity: Yes  Other Topics Concern   Not on file  Social History Narrative   Moved from [place]    Daughter and son in law live in [place]   Has daughter in [place] articles/investing   Enjoys  travelling   Worked as a Theatre manager, 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, hospital life         Social Determinants of Corporate investment banker Strain: Low Risk  (09/14/2022)   Overall Financial Resource Strain (CARDIA)    Difficulty of Paying Living Expenses: Not hard at all  Food Insecurity: No Food Insecurity (09/14/2022)   Hunger Vital Sign    Worried About Running Out of Food in the Last Year: Never true    Ran Out of Food in the Last Year: Never true  Transportation Needs: No Transportation Needs (09/14/2022)   PRAPARE - Administrator, Civil Service (Medical): No    Lack of Transportation (Non-Medical): No  Physical Activity: Sufficiently Active (09/14/2022)   Exercise Vital Sign    Days of Exercise per Week: 4 days    Minutes of Exercise per Session: 90 min  Stress: No Stress Concern Present (09/14/2022)   [employer] of Occupational Health - Occupational Stress Questionnaire    Feeling of Stress : Not at all  Social Connections: Socially Integrated (09/14/2022)   Social Connection and Isolation Panel [NHANES]    Frequency of Communication with Friends and Family: Three times a week    Frequency of Social Gatherings with Friends and Family: Twice a week    Attends Religious Services: More than 4 times per year    Active Member of [employer] or Organizations: Yes    Attends Engineer, structural: Not on file    Marital Status: Married  Catering manager Violence: Not on file    Outpatient Medications Prior to Visit  Medication Sig Dispense Refill   amLODipine (NORVASC) 5 MG tablet TAKE 1 TABLET (5 MG TOTAL) BY MOUTH DAILY. 90 tablet 1   azelastine (ASTELIN) 0.1 % nasal spray Place 2 sprays into both nostrils 2 (two) times daily. Use in each nostril as directed 30 mL 12   fexofenadine (ALLEGRA) 180 MG tablet 1 tablet Swallow whole with water; do not take with fruit juices.     fluticasone (FLONASE) 50 MCG/ACT nasal spray Place 1 spray into both nostrils daily as needed.      olopatadine (PATANOL) 0.1 % ophthalmic solution INSTILL 1 DROP INTO BOTH EYES TWICE A DAY 5 mL 5   carvedilol (COREG) 6.25 MG tablet Take 1 tablet (6.25 mg total) by mouth 2 (two) times daily with a meal. 180 tablet 0   hydrALAZINE (APRESOLINE) 25 MG tablet Take 1 tablet (25 mg total) by mouth 4 (four) times daily. 360 tablet 0   montelukast (SINGULAIR) 10 MG tablet Take 1 tablet (10 mg total) by mouth at bedtime. 90 tablet 0   Facility-Administered Medications Prior to Visit  Medication Dose Route Frequency Provider Last Rate Last Admin   0.9 %  sodium chloride infusion  500 mL Intravenous Once Hilarie Fredrickson, MD        Allergies  Allergen Reactions   Lisinopril     Stopped working   Penicillins Rash    ROS See HPI    Objective:    Physical Exam Constitutional:      General: He is not in acute distress.     Appearance: He is well-developed.  HENT:     Head: Normocephalic and atraumatic.  Cardiovascular:     Rate and Rhythm: Normal rate and regular rhythm.     Heart sounds: No murmur heard. Pulmonary:     Effort: Pulmonary effort is normal. No respiratory distress.  Breath sounds: Normal breath sounds. No wheezing or rales.  Skin:    General: Skin is warm and dry.     Comments: Scattered anular lesions noted on arms and legs  Neurological:     Mental Status: He is alert and oriented to person, place, and time.  Psychiatric:        Behavior: Behavior normal.        Thought Content: Thought content normal.      BP 128/62 (BP Location: Right Arm, Patient Position: Sitting, Cuff Size: Small)   Pulse (!) 54   Temp 98 F (36.7 C) (Oral)   Resp 16   Wt 168 lb (76.2 kg)   SpO2 100%   BMI 23.43 kg/m  Wt Readings from Last 3 Encounters:  09/15/22 168 lb (76.2 kg)  01/31/22 169 lb (76.7 kg)  04/18/21 171 lb (77.6 kg)       Assessment & Plan:   Problem List Items Addressed This Visit       Unprioritized   Skin rash    Presented with multiple lesions of varying age, not itchy. No improvement with hydrocortisone. Differential includes fungal infection such as ringworm. -Start Lamisil once daily for 7 days. -If no improvement in 2 weeks, refer to dermatology.       HTN (hypertension)    Stable on current regimen of amlodipine, carvedilol, and hydralazine (taken three times daily despite prescription for four times daily). -Continue current regimen. -Update hydralazine prescription to reflect actual usage.       Relevant Medications   hydrALAZINE (APRESOLINE) 25 MG tablet   carvedilol (COREG) 6.25 MG tablet   Other Relevant Orders   Comp Met (CMET)   Environmental allergies - Primary    Chronic, managed with daily Allegra and allergy shots. -Continue current regimen.       Relevant Medications   montelukast (SINGULAIR) 10 MG tablet    I have changed Garrison Petroni's  hydrALAZINE. I am also having him start on terbinafine. Additionally, I am having him maintain his fluticasone, fexofenadine, olopatadine, azelastine, amLODipine, montelukast, and carvedilol. We will continue to administer sodium chloride.  Meds ordered this encounter  Medications   terbinafine (LAMISIL) 250 MG tablet    Sig: Take 1 tablet (250 mg total) by mouth daily for 7 days.    Dispense:  7 tablet    Refill:  0    Order Specific Question:   Supervising Provider    Answer:   Danise Edge A [[id number]]   montelukast (SINGULAIR) 10 MG tablet    Sig: Take 1 tablet (10 mg total) by mouth at bedtime.    Dispense:  90 tablet    Refill:  0    Order Specific Question:   Supervising Provider    Answer:   Danise Edge A [[id number]]   hydrALAZINE (APRESOLINE) 25 MG tablet    Sig: Take 1 tablet (25 mg total) by mouth 3 (three) times daily.    Dispense:  360 tablet    Refill:  0    Order Specific Question:   Supervising Provider    Answer:   Danise Edge A [[id number]]   carvedilol (COREG) 6.25 MG tablet    Sig: Take 1 tablet (6.25 mg total) by mouth 2 (two) times daily with a meal.    Dispense:  180 tablet    Refill:  0    Order Specific Question:   Supervising Provider    Answer:   Danise Edge A [[id number]]

## 2022-09-15 NOTE — Assessment & Plan Note (Addendum)
Chronic, managed with daily Allegra and allergy shots. -Continue current regimen.

## 2022-09-15 NOTE — Assessment & Plan Note (Signed)
Presented with multiple lesions of varying age, not itchy. No improvement with hydrocortisone. Differential includes fungal infection such as ringworm. -Start Lamisil once daily for 7 days. -If no improvement in 2 weeks, refer to dermatology.

## 2022-09-16 ENCOUNTER — Telehealth: Payer: Self-pay | Admitting: Family

## 2022-09-16 DIAGNOSIS — E875 Hyperkalemia: Secondary | ICD-10-CM

## 2022-09-16 LAB — COMPREHENSIVE METABOLIC PANEL
AG Ratio: 1.8 (calc) (ref 1.0–2.5)
ALT: 19 U/L (ref 9–46)
AST: 21 U/L (ref 10–35)
Albumin: 4.9 g/dL (ref 3.6–5.1)
Alkaline phosphatase (APISO): 100 U/L (ref 35–144)
BUN: 9 mg/dL (ref 7–25)
CO2: 24 mmol/L (ref 20–32)
Calcium: 9.8 mg/dL (ref 8.6–10.3)
Chloride: 98 mmol/L (ref 98–110)
Creat: 0.95 mg/dL (ref 0.70–1.28)
Globulin: 2.7 g/dL (calc) (ref 1.9–3.7)
Glucose, Bld: 110 mg/dL — ABNORMAL HIGH (ref 65–99)
Potassium: 5.9 mmol/L — ABNORMAL HIGH (ref 3.5–5.3)
Sodium: 133 mmol/L — ABNORMAL LOW (ref 135–146)
Total Bilirubin: 0.8 mg/dL (ref 0.2–1.2)
Total Protein: 7.6 g/dL (ref 6.1–8.1)

## 2022-09-16 NOTE — Telephone Encounter (Signed)
Please contact pt to set up repeat bmet in 1 week, dxy hyperkalemia.

## 2022-09-18 NOTE — Telephone Encounter (Signed)
BMP ordered. Mychart message sent to Pt instructing to call office to schedule lab appt once he returns from beach.

## 2022-09-20 ENCOUNTER — Other Ambulatory Visit: Payer: Self-pay | Admitting: Family

## 2022-09-20 DIAGNOSIS — I10 Essential (primary) hypertension: Secondary | ICD-10-CM

## 2022-09-20 DIAGNOSIS — J3089 Other allergic rhinitis: Secondary | ICD-10-CM | POA: Diagnosis not present

## 2022-09-20 DIAGNOSIS — J301 Allergic rhinitis due to pollen: Secondary | ICD-10-CM | POA: Diagnosis not present

## 2022-09-20 DIAGNOSIS — J3081 Allergic rhinitis due to animal (cat) (dog) hair and dander: Secondary | ICD-10-CM | POA: Diagnosis not present

## 2022-09-25 DIAGNOSIS — T25121A Burn of first degree of right foot, initial encounter: Secondary | ICD-10-CM | POA: Diagnosis not present

## 2022-09-25 DIAGNOSIS — T24132A Burn of first degree of left lower leg, initial encounter: Secondary | ICD-10-CM | POA: Diagnosis not present

## 2022-09-25 DIAGNOSIS — R208 Other disturbances of skin sensation: Secondary | ICD-10-CM | POA: Diagnosis not present

## 2022-09-25 DIAGNOSIS — T25222A Burn of second degree of left foot, initial encounter: Secondary | ICD-10-CM | POA: Diagnosis not present

## 2022-10-03 DIAGNOSIS — L2089 Other atopic dermatitis: Secondary | ICD-10-CM | POA: Diagnosis not present

## 2022-10-03 DIAGNOSIS — L820 Inflamed seborrheic keratosis: Secondary | ICD-10-CM | POA: Diagnosis not present

## 2022-10-03 DIAGNOSIS — L570 Actinic keratosis: Secondary | ICD-10-CM | POA: Diagnosis not present

## 2022-10-03 DIAGNOSIS — C44319 Basal cell carcinoma of skin of other parts of face: Secondary | ICD-10-CM | POA: Diagnosis not present

## 2022-10-03 DIAGNOSIS — Z85828 Personal history of other malignant neoplasm of skin: Secondary | ICD-10-CM | POA: Diagnosis not present

## 2022-10-09 DIAGNOSIS — J3089 Other allergic rhinitis: Secondary | ICD-10-CM | POA: Diagnosis not present

## 2022-10-09 DIAGNOSIS — J301 Allergic rhinitis due to pollen: Secondary | ICD-10-CM | POA: Diagnosis not present

## 2022-10-09 DIAGNOSIS — J3081 Allergic rhinitis due to animal (cat) (dog) hair and dander: Secondary | ICD-10-CM | POA: Diagnosis not present

## 2022-10-20 DIAGNOSIS — J3081 Allergic rhinitis due to animal (cat) (dog) hair and dander: Secondary | ICD-10-CM | POA: Diagnosis not present

## 2022-10-20 DIAGNOSIS — J301 Allergic rhinitis due to pollen: Secondary | ICD-10-CM | POA: Diagnosis not present

## 2022-10-20 DIAGNOSIS — J3089 Other allergic rhinitis: Secondary | ICD-10-CM | POA: Diagnosis not present

## 2022-10-27 DIAGNOSIS — J3089 Other allergic rhinitis: Secondary | ICD-10-CM | POA: Diagnosis not present

## 2022-10-27 DIAGNOSIS — J301 Allergic rhinitis due to pollen: Secondary | ICD-10-CM | POA: Diagnosis not present

## 2022-10-27 DIAGNOSIS — J3081 Allergic rhinitis due to animal (cat) (dog) hair and dander: Secondary | ICD-10-CM | POA: Diagnosis not present

## 2022-11-03 ENCOUNTER — Other Ambulatory Visit (INDEPENDENT_AMBULATORY_CARE_PROVIDER_SITE_OTHER): Payer: PPO

## 2022-11-03 ENCOUNTER — Telehealth: Payer: Self-pay | Admitting: Family

## 2022-11-03 ENCOUNTER — Other Ambulatory Visit: Payer: Self-pay | Admitting: Family

## 2022-11-03 DIAGNOSIS — E870 Hyperosmolality and hypernatremia: Secondary | ICD-10-CM

## 2022-11-03 DIAGNOSIS — E875 Hyperkalemia: Secondary | ICD-10-CM

## 2022-11-03 DIAGNOSIS — E871 Hypo-osmolality and hyponatremia: Secondary | ICD-10-CM

## 2022-11-03 LAB — BASIC METABOLIC PANEL
BUN: 12 mg/dL (ref 6–23)
CO2: 28 mEq/L (ref 19–32)
Calcium: 9.1 mg/dL (ref 8.4–10.5)
Chloride: 91 mEq/L — ABNORMAL LOW (ref 96–112)
Creatinine, Ser: 0.91 mg/dL (ref 0.40–1.50)
GFR: 82.52 mL/min (ref >60.00)
Glucose, Bld: 140 mg/dL — ABNORMAL HIGH (ref 70–99)
Potassium: 4.1 mEq/L (ref 3.5–5.1)
Sodium: 125 mEq/L — ABNORMAL LOW (ref 135–145)

## 2022-11-03 NOTE — Telephone Encounter (Signed)
Results, provider's advise and specific instructions for water intake limitations and salt increase given to patient. He verbalized understanding and was scheduled to come in for labs Monday.

## 2022-11-03 NOTE — Telephone Encounter (Signed)
Please advise pt as follows:  Sodium lower than his baseline at 125mg . Potassium has returned to normal.   Sugar was quite high at 140.    Plan 1.5 L fluid restriction over the weekend.  OK to salt his food.   Repeat labs on Monday.  I would also like for him to see nephrology. Referral placed.    Please advise pt to go to the ER should he develop weakness, heart palpitations or confusion.

## 2022-11-06 ENCOUNTER — Other Ambulatory Visit (INDEPENDENT_AMBULATORY_CARE_PROVIDER_SITE_OTHER): Payer: PPO

## 2022-11-06 DIAGNOSIS — E871 Hypo-osmolality and hyponatremia: Secondary | ICD-10-CM

## 2022-11-06 DIAGNOSIS — E870 Hyperosmolality and hypernatremia: Secondary | ICD-10-CM

## 2022-11-06 LAB — BASIC METABOLIC PANEL
BUN: 17 mg/dL (ref 6–23)
CO2: 27 mEq/L (ref 19–32)
Calcium: 8.9 mg/dL (ref 8.4–10.5)
Chloride: 98 mEq/L (ref 96–112)
Creatinine, Ser: 1.05 mg/dL (ref 0.40–1.50)
GFR: 69.49 mL/min (ref >60.00)
Glucose, Bld: 124 mg/dL — ABNORMAL HIGH (ref 70–99)
Potassium: 3.8 mEq/L (ref 3.5–5.1)
Sodium: 135 mEq/L (ref 135–145)

## 2022-11-06 LAB — HEMOGLOBIN A1C: Hgb A1c MFr Bld: 5.4 % (ref 4.6–6.5)

## 2022-11-07 DIAGNOSIS — C44319 Basal cell carcinoma of skin of other parts of face: Secondary | ICD-10-CM | POA: Diagnosis not present

## 2022-11-07 DIAGNOSIS — Z85828 Personal history of other malignant neoplasm of skin: Secondary | ICD-10-CM | POA: Diagnosis not present

## 2022-11-07 HISTORY — PX: MOHS SURGERY: SUR867

## 2022-11-07 LAB — OSMOLALITY, URINE: Osmolality, Ur: 671 mOsm/kg (ref 50–1200)

## 2022-11-07 LAB — SODIUM, URINE, RANDOM: Sodium, Ur: 55 mmol/L (ref 28–272)

## 2022-11-07 LAB — OSMOLALITY: Osmolality: 287 mosm/kg (ref 278–305)

## 2022-11-10 DIAGNOSIS — J3089 Other allergic rhinitis: Secondary | ICD-10-CM | POA: Diagnosis not present

## 2022-11-10 DIAGNOSIS — J301 Allergic rhinitis due to pollen: Secondary | ICD-10-CM | POA: Diagnosis not present

## 2022-11-10 DIAGNOSIS — J3081 Allergic rhinitis due to animal (cat) (dog) hair and dander: Secondary | ICD-10-CM | POA: Diagnosis not present

## 2022-11-16 DIAGNOSIS — J3089 Other allergic rhinitis: Secondary | ICD-10-CM | POA: Diagnosis not present

## 2022-11-16 DIAGNOSIS — J3081 Allergic rhinitis due to animal (cat) (dog) hair and dander: Secondary | ICD-10-CM | POA: Diagnosis not present

## 2022-11-16 DIAGNOSIS — J301 Allergic rhinitis due to pollen: Secondary | ICD-10-CM | POA: Diagnosis not present

## 2022-11-17 ENCOUNTER — Encounter: Payer: Self-pay | Admitting: Family

## 2022-11-17 DIAGNOSIS — C4491 Basal cell carcinoma of skin, unspecified: Secondary | ICD-10-CM | POA: Insufficient documentation

## 2022-11-27 DIAGNOSIS — J301 Allergic rhinitis due to pollen: Secondary | ICD-10-CM | POA: Diagnosis not present

## 2022-11-27 DIAGNOSIS — J3089 Other allergic rhinitis: Secondary | ICD-10-CM | POA: Diagnosis not present

## 2022-11-27 DIAGNOSIS — J3081 Allergic rhinitis due to animal (cat) (dog) hair and dander: Secondary | ICD-10-CM | POA: Diagnosis not present

## 2022-12-10 ENCOUNTER — Other Ambulatory Visit: Payer: Self-pay | Admitting: Family

## 2022-12-10 DIAGNOSIS — Z9109 Other allergy status, other than to drugs and biological substances: Secondary | ICD-10-CM

## 2022-12-12 ENCOUNTER — Other Ambulatory Visit: Payer: Self-pay | Admitting: Family

## 2022-12-12 DIAGNOSIS — J3081 Allergic rhinitis due to animal (cat) (dog) hair and dander: Secondary | ICD-10-CM | POA: Diagnosis not present

## 2022-12-12 DIAGNOSIS — I10 Essential (primary) hypertension: Secondary | ICD-10-CM

## 2022-12-12 DIAGNOSIS — J3089 Other allergic rhinitis: Secondary | ICD-10-CM | POA: Diagnosis not present

## 2022-12-12 DIAGNOSIS — J301 Allergic rhinitis due to pollen: Secondary | ICD-10-CM | POA: Diagnosis not present

## 2022-12-20 ENCOUNTER — Other Ambulatory Visit: Payer: Self-pay

## 2022-12-20 MED ORDER — AMLODIPINE BESYLATE 5 MG PO TABS: 5.0000 mg | ORAL_TABLET | Freq: Every day | ORAL | 1 refills | Status: DC

## 2022-12-22 DIAGNOSIS — J3089 Other allergic rhinitis: Secondary | ICD-10-CM | POA: Diagnosis not present

## 2022-12-22 DIAGNOSIS — J3081 Allergic rhinitis due to animal (cat) (dog) hair and dander: Secondary | ICD-10-CM | POA: Diagnosis not present

## 2022-12-22 DIAGNOSIS — J301 Allergic rhinitis due to pollen: Secondary | ICD-10-CM | POA: Diagnosis not present

## 2022-12-29 DIAGNOSIS — J3081 Allergic rhinitis due to animal (cat) (dog) hair and dander: Secondary | ICD-10-CM | POA: Diagnosis not present

## 2022-12-29 DIAGNOSIS — J3089 Other allergic rhinitis: Secondary | ICD-10-CM | POA: Diagnosis not present

## 2022-12-29 DIAGNOSIS — J301 Allergic rhinitis due to pollen: Secondary | ICD-10-CM | POA: Diagnosis not present

## 2023-01-02 ENCOUNTER — Ambulatory Visit: Payer: PPO | Admitting: Family

## 2023-01-02 VITALS — BP 122/68 | HR 65 | Temp 97.8°F | Resp 16 | Wt 166.0 lb

## 2023-01-02 DIAGNOSIS — K4090 Unilateral inguinal hernia, without obstruction or gangrene, not specified as recurrent: Secondary | ICD-10-CM | POA: Diagnosis not present

## 2023-01-03 DIAGNOSIS — K4090 Unilateral inguinal hernia, without obstruction or gangrene, not specified as recurrent: Secondary | ICD-10-CM | POA: Insufficient documentation
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
## 2023-01-03 NOTE — Assessment & Plan Note (Signed)
New. Refer to Surgeon for further evaluation.

## 2023-01-04 DIAGNOSIS — J3081 Allergic rhinitis due to animal (cat) (dog) hair and dander: Secondary | ICD-10-CM | POA: Diagnosis not present

## 2023-01-04 DIAGNOSIS — J3089 Other allergic rhinitis: Secondary | ICD-10-CM | POA: Diagnosis not present

## 2023-01-04 DIAGNOSIS — J301 Allergic rhinitis due to pollen: Secondary | ICD-10-CM | POA: Diagnosis not present

## 2023-01-05 DIAGNOSIS — K4090 Unilateral inguinal hernia, without obstruction or gangrene, not specified as recurrent: Secondary | ICD-10-CM | POA: Diagnosis not present

## 2023-01-10 DIAGNOSIS — J3089 Other allergic rhinitis: Secondary | ICD-10-CM | POA: Diagnosis not present

## 2023-01-10 DIAGNOSIS — J3081 Allergic rhinitis due to animal (cat) (dog) hair and dander: Secondary | ICD-10-CM | POA: Diagnosis not present

## 2023-01-10 DIAGNOSIS — J301 Allergic rhinitis due to pollen: Secondary | ICD-10-CM | POA: Diagnosis not present

## 2023-01-18 DIAGNOSIS — J3089 Other allergic rhinitis: Secondary | ICD-10-CM | POA: Diagnosis not present

## 2023-01-18 DIAGNOSIS — J301 Allergic rhinitis due to pollen: Secondary | ICD-10-CM | POA: Diagnosis not present

## 2023-01-18 DIAGNOSIS — J3081 Allergic rhinitis due to animal (cat) (dog) hair and dander: Secondary | ICD-10-CM | POA: Diagnosis not present

## 2023-01-26 DIAGNOSIS — J3081 Allergic rhinitis due to animal (cat) (dog) hair and dander: Secondary | ICD-10-CM | POA: Diagnosis not present

## 2023-01-26 DIAGNOSIS — J3089 Other allergic rhinitis: Secondary | ICD-10-CM | POA: Diagnosis not present

## 2023-01-26 DIAGNOSIS — J301 Allergic rhinitis due to pollen: Secondary | ICD-10-CM | POA: Diagnosis not present

## 2023-02-05 DIAGNOSIS — J3081 Allergic rhinitis due to animal (cat) (dog) hair and dander: Secondary | ICD-10-CM | POA: Diagnosis not present

## 2023-02-05 DIAGNOSIS — J301 Allergic rhinitis due to pollen: Secondary | ICD-10-CM | POA: Diagnosis not present

## 2023-02-05 DIAGNOSIS — J3089 Other allergic rhinitis: Secondary | ICD-10-CM | POA: Diagnosis not present

## 2023-02-14 ENCOUNTER — Other Ambulatory Visit: Payer: Self-pay | Admitting: Family

## 2023-02-20 DIAGNOSIS — J3081 Allergic rhinitis due to animal (cat) (dog) hair and dander: Secondary | ICD-10-CM | POA: Diagnosis not present

## 2023-02-20 DIAGNOSIS — J301 Allergic rhinitis due to pollen: Secondary | ICD-10-CM | POA: Diagnosis not present

## 2023-02-20 DIAGNOSIS — J3089 Other allergic rhinitis: Secondary | ICD-10-CM | POA: Diagnosis not present

## 2023-02-22 DIAGNOSIS — I10 Essential (primary) hypertension: Secondary | ICD-10-CM | POA: Diagnosis not present

## 2023-02-22 DIAGNOSIS — E871 Hypo-osmolality and hyponatremia: Secondary | ICD-10-CM | POA: Diagnosis not present

## 2023-02-22 DIAGNOSIS — E875 Hyperkalemia: Secondary | ICD-10-CM | POA: Diagnosis not present

## 2023-03-01 DIAGNOSIS — J3081 Allergic rhinitis due to animal (cat) (dog) hair and dander: Secondary | ICD-10-CM | POA: Diagnosis not present

## 2023-03-01 DIAGNOSIS — J301 Allergic rhinitis due to pollen: Secondary | ICD-10-CM | POA: Diagnosis not present

## 2023-03-01 DIAGNOSIS — J3089 Other allergic rhinitis: Secondary | ICD-10-CM | POA: Diagnosis not present

## 2023-03-08 DIAGNOSIS — J3081 Allergic rhinitis due to animal (cat) (dog) hair and dander: Secondary | ICD-10-CM | POA: Diagnosis not present

## 2023-03-08 DIAGNOSIS — J3089 Other allergic rhinitis: Secondary | ICD-10-CM | POA: Diagnosis not present

## 2023-03-08 DIAGNOSIS — J301 Allergic rhinitis due to pollen: Secondary | ICD-10-CM | POA: Diagnosis not present

## 2023-03-09 ENCOUNTER — Other Ambulatory Visit: Payer: Self-pay | Admitting: Family

## 2023-03-09 DIAGNOSIS — Z9109 Other allergy status, other than to drugs and biological substances: Secondary | ICD-10-CM

## 2023-03-09 DIAGNOSIS — I10 Essential (primary) hypertension: Secondary | ICD-10-CM

## 2023-03-15 ENCOUNTER — Other Ambulatory Visit: Payer: Self-pay | Admitting: Family

## 2023-03-15 DIAGNOSIS — I10 Essential (primary) hypertension: Secondary | ICD-10-CM

## 2023-03-16 MED ORDER — HYDRALAZINE HCL 25 MG PO TABS: 25.0000 mg | ORAL_TABLET | Freq: Three times a day (TID) | ORAL | 0 refills | Status: DC

## 2023-03-16 NOTE — Telephone Encounter (Signed)
Patient stopped by office wanting to make sure medication HYDRALAZINE was refilled and sent to [facility] on [address]. Please advise

## 2023-03-17 ENCOUNTER — Other Ambulatory Visit: Payer: Self-pay | Admitting: Family

## 2023-03-17 DIAGNOSIS — I10 Essential (primary) hypertension: Secondary | ICD-10-CM

## 2023-03-23 DIAGNOSIS — J3089 Other allergic rhinitis: Secondary | ICD-10-CM | POA: Diagnosis not present

## 2023-03-23 DIAGNOSIS — J3081 Allergic rhinitis due to animal (cat) (dog) hair and dander: Secondary | ICD-10-CM | POA: Diagnosis not present

## 2023-03-23 DIAGNOSIS — J301 Allergic rhinitis due to pollen: Secondary | ICD-10-CM | POA: Diagnosis not present

## 2023-04-06 DIAGNOSIS — J3081 Allergic rhinitis due to animal (cat) (dog) hair and dander: Secondary | ICD-10-CM | POA: Diagnosis not present

## 2023-04-06 DIAGNOSIS — J3089 Other allergic rhinitis: Secondary | ICD-10-CM | POA: Diagnosis not present

## 2023-04-06 DIAGNOSIS — J301 Allergic rhinitis due to pollen: Secondary | ICD-10-CM | POA: Diagnosis not present

## 2023-04-12 DIAGNOSIS — J3089 Other allergic rhinitis: Secondary | ICD-10-CM | POA: Diagnosis not present

## 2023-04-12 DIAGNOSIS — J301 Allergic rhinitis due to pollen: Secondary | ICD-10-CM | POA: Diagnosis not present

## 2023-04-12 DIAGNOSIS — J3081 Allergic rhinitis due to animal (cat) (dog) hair and dander: Secondary | ICD-10-CM | POA: Diagnosis not present

## 2023-04-18 DIAGNOSIS — J3089 Other allergic rhinitis: Secondary | ICD-10-CM | POA: Diagnosis not present

## 2023-04-18 DIAGNOSIS — J301 Allergic rhinitis due to pollen: Secondary | ICD-10-CM | POA: Diagnosis not present

## 2023-04-18 DIAGNOSIS — J3081 Allergic rhinitis due to animal (cat) (dog) hair and dander: Secondary | ICD-10-CM | POA: Diagnosis not present

## 2023-04-20 DIAGNOSIS — K4090 Unilateral inguinal hernia, without obstruction or gangrene, not specified as recurrent: Secondary | ICD-10-CM | POA: Diagnosis not present

## 2023-05-07 DIAGNOSIS — H1045 Other chronic allergic conjunctivitis: Secondary | ICD-10-CM | POA: Diagnosis not present

## 2023-05-07 DIAGNOSIS — H43813 Vitreous degeneration, bilateral: Secondary | ICD-10-CM | POA: Diagnosis not present

## 2023-05-07 DIAGNOSIS — H2513 Age-related nuclear cataract, bilateral: Secondary | ICD-10-CM | POA: Diagnosis not present

## 2023-05-07 DIAGNOSIS — H52223 Regular astigmatism, bilateral: Secondary | ICD-10-CM | POA: Diagnosis not present

## 2023-05-07 DIAGNOSIS — H524 Presbyopia: Secondary | ICD-10-CM | POA: Diagnosis not present

## 2023-05-11 DIAGNOSIS — J3081 Allergic rhinitis due to animal (cat) (dog) hair and dander: Secondary | ICD-10-CM | POA: Diagnosis not present

## 2023-05-11 DIAGNOSIS — J3089 Other allergic rhinitis: Secondary | ICD-10-CM | POA: Diagnosis not present

## 2023-05-11 DIAGNOSIS — J301 Allergic rhinitis due to pollen: Secondary | ICD-10-CM | POA: Diagnosis not present

## 2023-05-12 ENCOUNTER — Other Ambulatory Visit: Payer: Self-pay | Admitting: Family

## 2023-06-02 ENCOUNTER — Other Ambulatory Visit: Payer: Self-pay | Admitting: Family

## 2023-06-02 DIAGNOSIS — Z9109 Other allergy status, other than to drugs and biological substances: Secondary | ICD-10-CM

## 2023-06-02 DIAGNOSIS — I10 Essential (primary) hypertension: Secondary | ICD-10-CM

## 2023-06-06 ENCOUNTER — Other Ambulatory Visit: Payer: Self-pay | Admitting: Family

## 2023-06-10 ENCOUNTER — Other Ambulatory Visit: Payer: Self-pay | Admitting: Family

## 2023-06-12 ENCOUNTER — Other Ambulatory Visit: Payer: Self-pay | Admitting: Family

## 2023-06-12 DIAGNOSIS — I10 Essential (primary) hypertension: Secondary | ICD-10-CM

## 2023-07-19 ENCOUNTER — Other Ambulatory Visit: Payer: Self-pay | Admitting: Family

## 2023-07-19 DIAGNOSIS — I10 Essential (primary) hypertension: Secondary | ICD-10-CM

## 2023-07-19 IMAGING — DX DG ELBOW COMPLETE 3+V*L*
4 series · 4 of 4 positions shown · non-contrast
Comparison: None.

CLINICAL DATA: Left elbow pain and swelling.  No reported injury.

EXAM:
LEFT ELBOW - COMPLETE 3+ VIEW

[elbow ap]
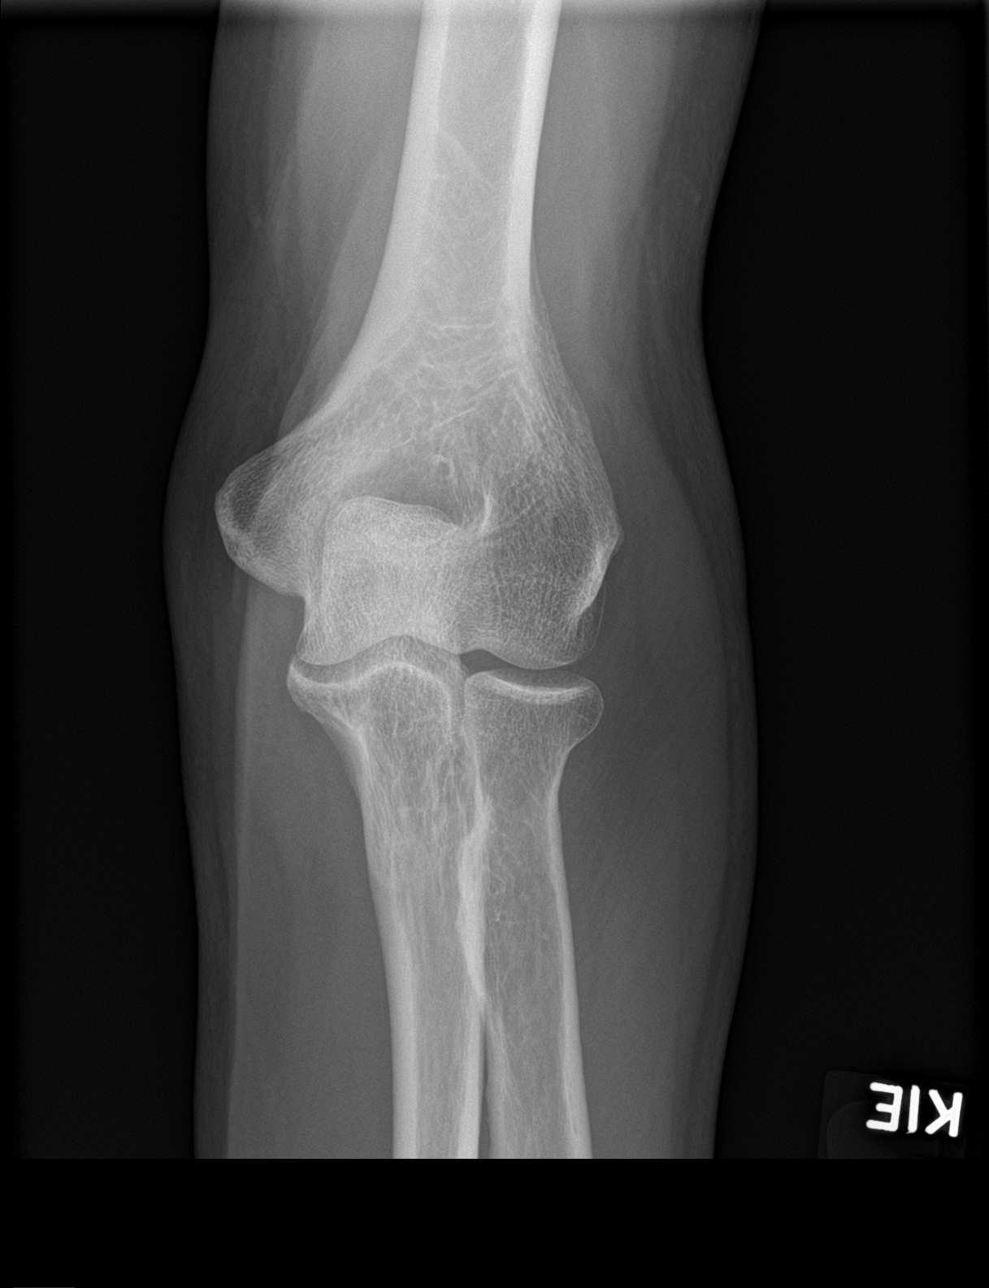

[elbow obl (1 of 2)]
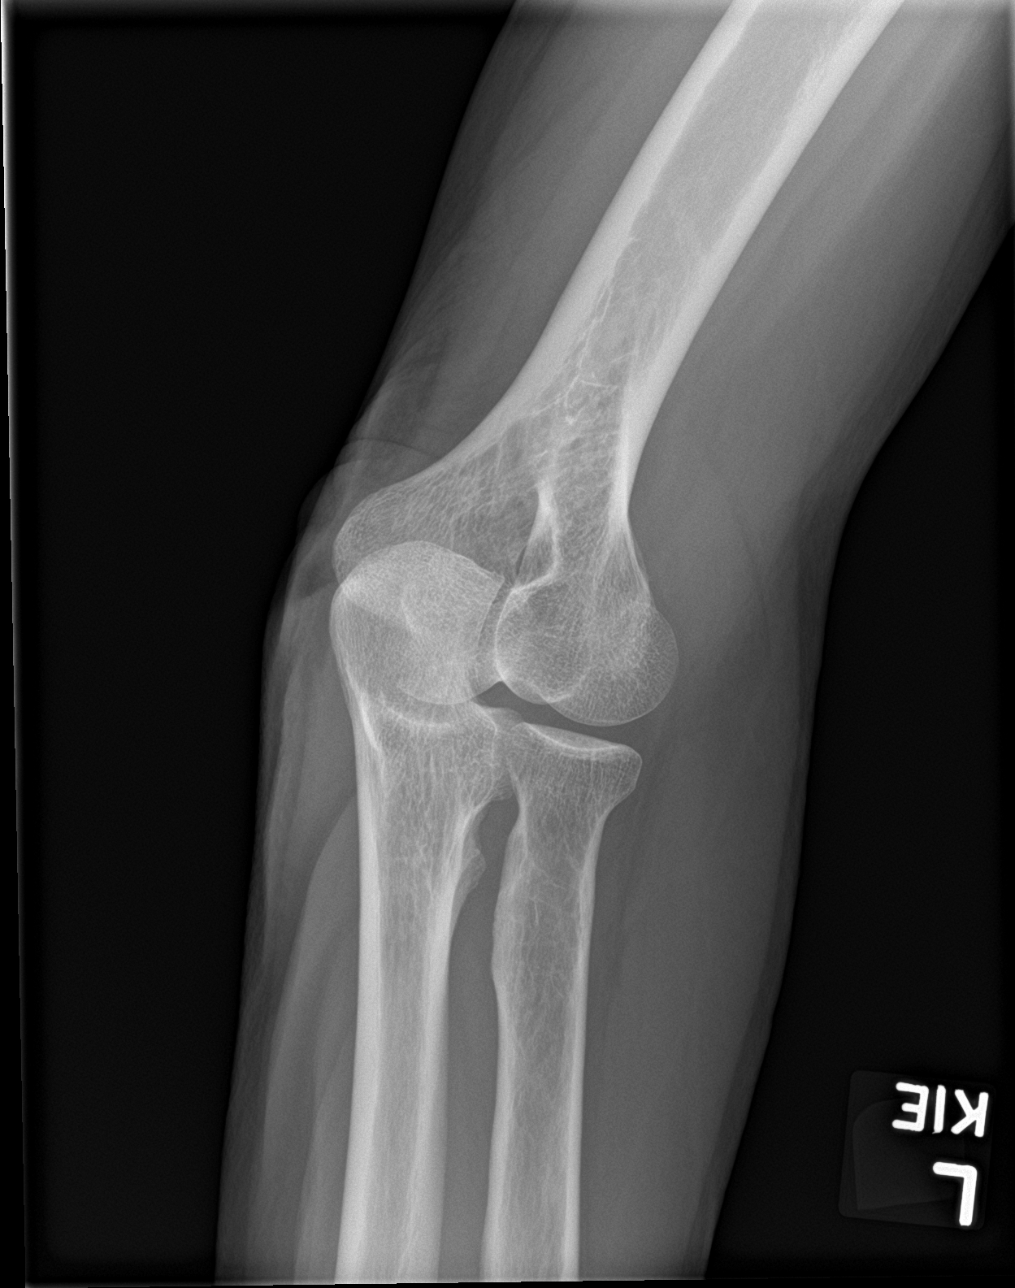

[elbow obl (2 of 2)]
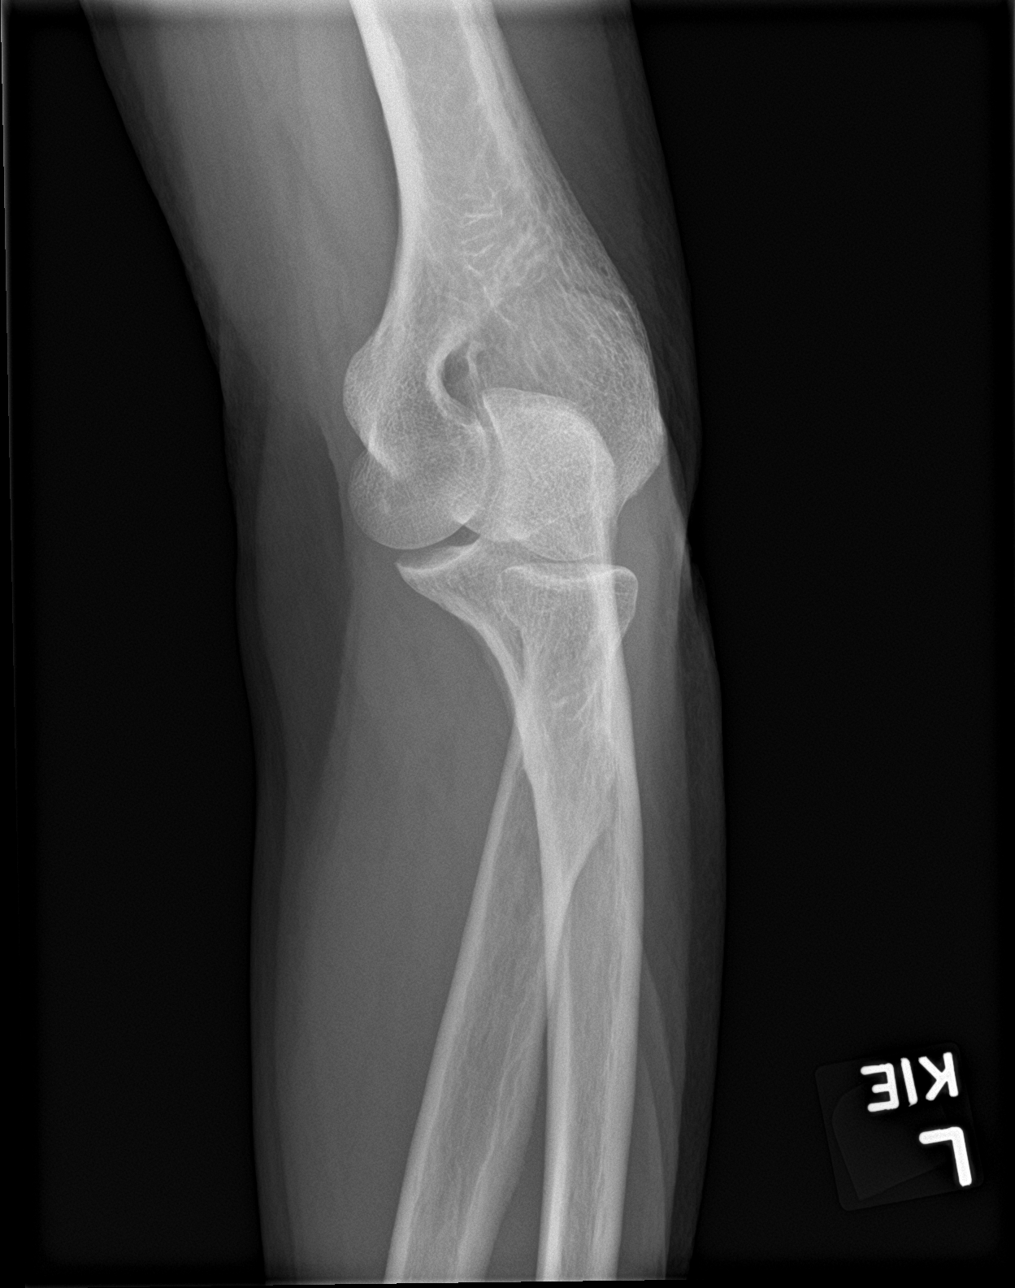

[elbow lat]
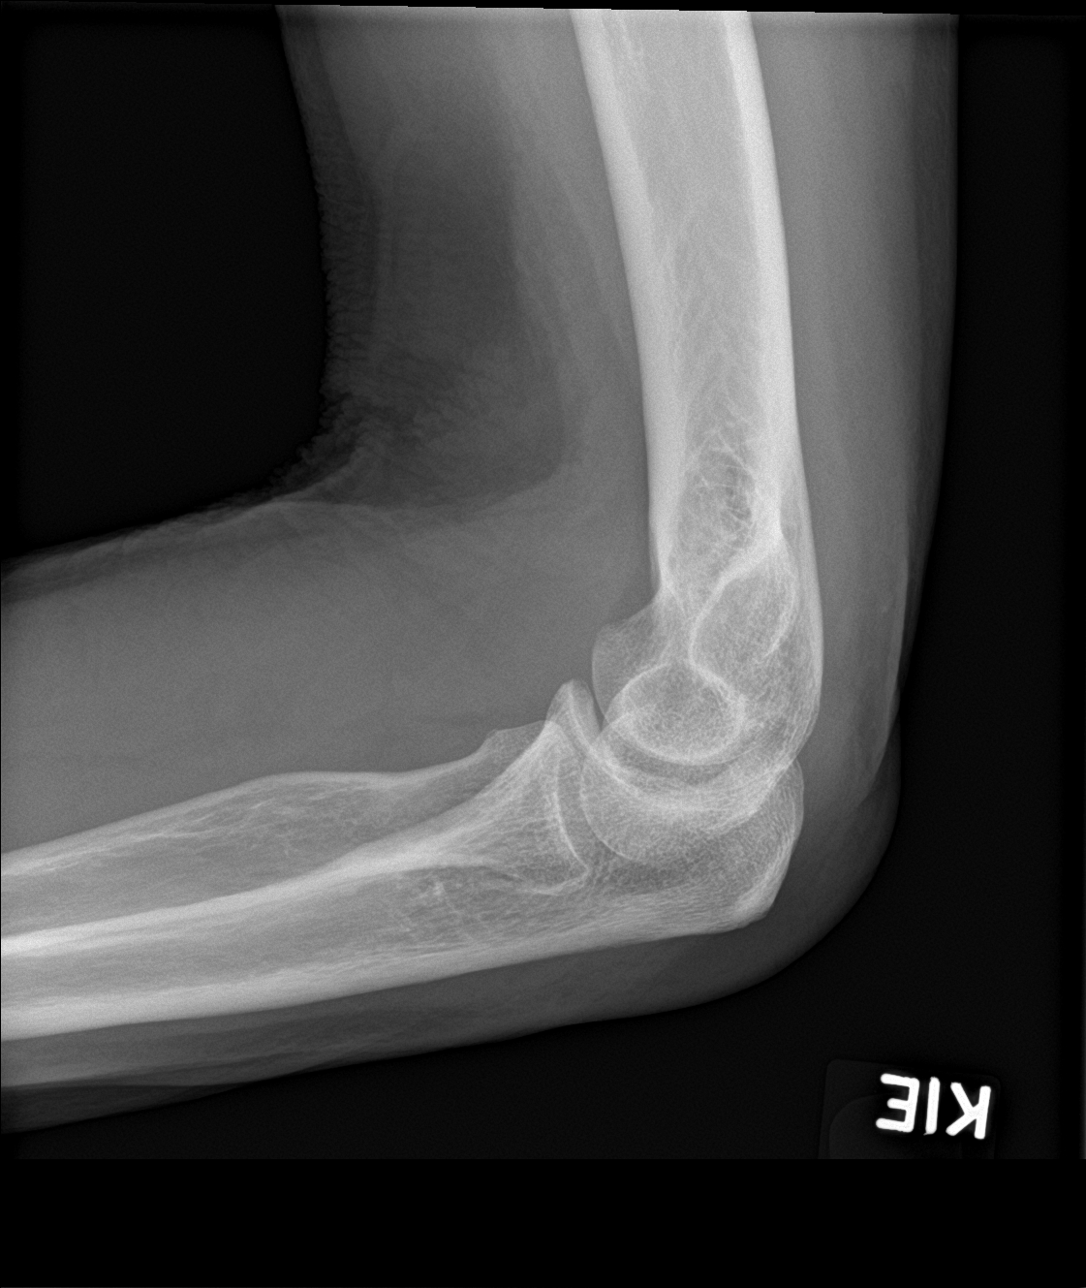

[4 of 4 positions shown; findings below may reference images not displayed]

FINDINGS: There is no evidence of fracture, dislocation, or joint effusion.
There is no evidence of arthropathy or other focal bone abnormality.
Soft tissues are unremarkable.
IMPRESSION: Negative.

## 2023-07-20 MED ORDER — HYDRALAZINE HCL 25 MG PO TABS: 25.0000 mg | ORAL_TABLET | Freq: Four times a day (QID) | ORAL | 1 refills | Status: DC

## 2023-08-26 ENCOUNTER — Other Ambulatory Visit: Payer: Self-pay | Admitting: Family

## 2023-08-26 DIAGNOSIS — I10 Essential (primary) hypertension: Secondary | ICD-10-CM

## 2023-08-26 DIAGNOSIS — Z9109 Other allergy status, other than to drugs and biological substances: Secondary | ICD-10-CM

## 2023-10-12 ENCOUNTER — Other Ambulatory Visit: Payer: Self-pay

## 2023-10-12 ENCOUNTER — Emergency Department (HOSPITAL_BASED_OUTPATIENT_CLINIC_OR_DEPARTMENT_OTHER)
Admission: EM | Admit: 2023-10-12 | Discharge: 2023-10-12 | Disposition: A | Discharge status: Home / Self Care | Attending: Emergency Medicine | Admitting: Emergency Medicine

## 2023-10-12 DIAGNOSIS — I10 Essential (primary) hypertension: Secondary | ICD-10-CM | POA: Insufficient documentation

## 2023-10-12 DIAGNOSIS — Z23 Encounter for immunization: Secondary | ICD-10-CM | POA: Diagnosis not present

## 2023-10-12 DIAGNOSIS — Z79899 Other long term (current) drug therapy: Secondary | ICD-10-CM | POA: Diagnosis not present

## 2023-10-12 DIAGNOSIS — S01511A Laceration without foreign body of lip, initial encounter: Secondary | ICD-10-CM | POA: Insufficient documentation

## 2023-10-12 DIAGNOSIS — Y92009 Unspecified place in unspecified non-institutional (private) residence as the place of occurrence of the external cause: Secondary | ICD-10-CM | POA: Insufficient documentation

## 2023-10-12 DIAGNOSIS — Y9301 Activity, walking, marching and hiking: Secondary | ICD-10-CM | POA: Diagnosis not present

## 2023-10-12 DIAGNOSIS — W01198A Fall on same level from slipping, tripping and stumbling with subsequent striking against other object, initial encounter: Secondary | ICD-10-CM | POA: Insufficient documentation

## 2023-10-12 DIAGNOSIS — S0993XA Unspecified injury of face, initial encounter: Secondary | ICD-10-CM | POA: Diagnosis present

## 2023-10-12 DIAGNOSIS — W19XXXA Unspecified fall, initial encounter: Secondary | ICD-10-CM

## 2023-10-12 MED ORDER — LIDOCAINE-EPINEPHRINE (PF) 2 %-1:200000 IJ SOLN
10.0000 mL | Freq: Once | INTRAMUSCULAR | Status: AC
  Administered 2023-10-12: 10 mL
  Filled 2023-10-12: qty 20

## 2023-10-12 MED ORDER — TETANUS-DIPHTH-ACELL PERTUSSIS 5-2.5-18.5 LF-MCG/0.5 IM SUSY
0.5000 mL | PREFILLED_SYRINGE | Freq: Once | INTRAMUSCULAR | Status: AC
  Administered 2023-10-12: 0.5 mL via INTRAMUSCULAR
  Filled 2023-10-12: qty 0.5

## 2023-10-12 NOTE — ED Provider Notes (Signed)
 [place] [facility] Provider Note   CSN: [id number] Arrival date & time: 10/12/23  2031     Patient presents with: Gregory Brennan   Gregory Brennan is a 78 y.o. male.   Patient is a 78 year old male with a history of hypertension who is presenting today after a fall at home.  He reports that he was walking and tripped and fell hitting his mouth on the step.  It chipped his tooth and cut his lip.  He denies any loss of consciousness.  He has no neck pain, headache or pain in his arms or legs.  No difficulty walking.  His tooth is not loose.  The history is provided by the patient.  Fall       Prior to Admission medications   Medication Sig Start Date End Date Taking? Authorizing Provider  amLODipine  (NORVASC ) 5 MG tablet TAKE 1 TABLET (5 MG TOTAL) BY MOUTH DAILY. 06/11/23   O'Sullivan, Melissa, NP  Azelastine  HCl 137 MCG/SPRAY SOLN PLACE 2 SPRAYS INTO BOTH NOSTRILS 2 (TWO) TIMES DAILY. USE IN EACH NOSTRIL AS DIRECTED 06/06/23   Daryl Setter, NP  carvedilol  (COREG ) 6.25 MG tablet TAKE 1 TABLET BY MOUTH 2 TIMES DAILY WITH A MEAL. 06/02/23   Webb, Padonda B, FNP  fexofenadine (ALLEGRA) 180 MG tablet 1 tablet Swallow whole with water; do not take with fruit juices.    [provider]  fluticasone (FLONASE) 50 MCG/ACT nasal spray Place 1 spray into both nostrils daily as needed.     [provider]  hydrALAZINE  (APRESOLINE ) 25 MG tablet Take 1 tablet (25 mg total) by mouth 4 (four) times daily. 07/20/23   O'Sullivan, Melissa, NP  montelukast  (SINGULAIR ) 10 MG tablet TAKE 1 TABLET BY MOUTH EVERYDAY AT BEDTIME 06/02/23   Webb, Padonda B, FNP    Allergies: Lisinopril  and Penicillins    Review of Systems  Updated Vital Signs BP (!) 162/95 (BP Location: Right Arm)   Pulse (!) 57   Temp 98.3 F (36.8 C)   Resp 18   Ht 5' 11 (1.803 m)   Wt 74.8 kg   SpO2 99%   BMI 23.01 kg/m   Physical Exam Vitals and nursing note reviewed.   Constitutional:      General: He is not in acute distress.    Appearance: He is well-developed.  HENT:     Head: Normocephalic and atraumatic.     Mouth/Throat:   Eyes:     Conjunctiva/sclera: Conjunctivae normal.     Pupils: Pupils are equal, round, and reactive to light.  Cardiovascular:     Rate and Rhythm: Normal rate.  Pulmonary:     Effort: Pulmonary effort is normal. No respiratory distress.  Musculoskeletal:     Cervical back: Normal range of motion and neck supple. No tenderness.  Neurological:     Mental Status: He is alert and oriented to person, place, and time. Mental status is at baseline.  Psychiatric:        Behavior: Behavior normal.     (all labs ordered are listed, but only abnormal results are displayed) Labs Reviewed - No data to display  EKG: None  Radiology: No results found.   Procedures  LACERATION REPAIR Performed by: [facility] Authorized by: Benton Shone Consent: Verbal consent obtained. Risks and benefits: risks, benefits and alternatives were discussed Consent given by: patient Patient identity confirmed: provided demographic data Prepped and Draped in normal sterile fashion Wound explored  Laceration Location: upper lip  Laceration Length: 1cm  No Foreign Bodies seen or palpated  Anesthesia: local infiltration  Local anesthetic: lidocaine 2% with epinephrine  Anesthetic total: 2 ml  Irrigation method: syringe Amount of cleaning: standard  Skin closure: 6.0 prolene  Number of sutures: 3  Technique: simple interrupted  Patient tolerance: Patient tolerated the procedure well with no immediate complications.   Medications Ordered in the ED  lidocaine-EPINEPHrine (XYLOCAINE W/EPI) 2 %-1:200000 (PF) injection 10 mL (10 mLs Infiltration Given 10/12/23 2232)  Tdap (BOOSTRIX) injection 0.5 mL (0.5 mLs Intramuscular Given 10/12/23 2233)                                    Medical Decision  Making Risk Prescription drug management.   Patient with mechanical fall today and injury to the lip and tooth.  No loose teeth or dentin exposure.  Will need to follow-up with the dentist for chipped tooth repair.  Does have a laceration of the upper lip that appeared to be through and through.  External laceration was repaired as above.  Tetanus shot was updated.  No anticoagulation or concern for intracranial or cervical injury.  Low suspicion for facial fractures.     Final diagnoses:  Fall, initial encounter  Lip laceration, initial encounter    ED Discharge Orders     None          Doretha Folks, MD 10/12/23 2255

## 2023-10-12 NOTE — ED Triage Notes (Signed)
 Pt POV- reports tripping off bottom stair, c/o upper lip laceration, chipped tooth.   Denies blood thinners, LOC.

## 2023-10-12 NOTE — Discharge Instructions (Signed)
 Avoid any spicy foods or foods with sharp corners that could get stuck in the cut.  Follow-up with your dentist about getting your tooth fixed.

## 2023-10-17 ENCOUNTER — Encounter: Payer: Self-pay | Admitting: Family Medicine

## 2023-10-17 ENCOUNTER — Ambulatory Visit: Admitting: Family Medicine

## 2023-10-17 VITALS — BP 127/59 | HR 55 | Ht 71.0 in | Wt 162.0 lb

## 2023-10-17 DIAGNOSIS — Z4802 Encounter for removal of sutures: Secondary | ICD-10-CM | POA: Diagnosis not present

## 2023-10-17 DIAGNOSIS — S01511A Laceration without foreign body of lip, initial encounter: Secondary | ICD-10-CM

## 2023-10-17 NOTE — Progress Notes (Signed)
   Acute Office Visit  Subjective:     Patient ID: Gregory Brennan, male    DOB: 1945/08/31, 78 y.o.   MRN: [id number]  Chief Complaint  Patient presents with   Hospitalization Follow-up    HPI Patient is in today for hospital follow-up/suture removal.   Discussed the use of AI scribe software for clinical note transcription with the patient, who gave verbal consent to proceed.  History of Present Illness Gregory Brennan is a 78 year old male who presents with a laceration on his lip after a fall.  He sustained a laceration on his lip after falling down the stairs in his home, which has steep and narrow stairs. The incident occurred while he was moving items to a new residence. He stumbled on the last step, causing his tooth to break and cut into his lip. No dizziness or loss of consciousness occurred during the fall. He immediately sought medical attention at the emergency room, where he received three stitches to close the laceration. He reports that pain is not a problem and that his lip is giving him no trouble.  He is generally careful on stairs and avoids using ladders. He is moving to a new home with more traditional, wider stairs, which he believes will be safer.  Sutures have been in place for 5 days. He was told to get them removed after 4-5 days.         ROS All review of systems negative except what is listed in the HPI      Objective:    BP (!) 127/59   Pulse (!) 55   Ht 5' 11 (1.803 m)   Wt 162 lb (73.5 kg)   SpO2 99%   BMI 22.59 kg/m    Physical Exam Vitals reviewed.  Constitutional:      Appearance: Normal appearance.  HENT:     Mouth/Throat:      Comments: Laceration to left upper lip, 3 sutures in place, edges well approximated, healing nicely, minimal scabbing, no erythema, edema, purulent drainage Skin:    General: Skin is warm and dry.  Neurological:     Mental Status: He is alert and oriented to person, place, and time.  Psychiatric:         Mood and Affect: Mood normal.        Behavior: Behavior normal.        Thought Content: Thought content normal.        Judgment: Judgment normal.     No results found for any visits on 10/17/23.      Assessment & Plan:   Problem List Items Addressed This Visit   None Visit Diagnoses       Lip laceration, initial encounter    -  Primary     Visit for suture removal          Patient presents for suture removal. The wound is well healed without signs of infection.  The sutures (x3) were removed. Wound care and activity instructions given. Return prn.   No orders of the defined types were placed in this encounter.   Return if symptoms worsen or fail to improve.  Waddell KATHEE Mon, NP

## 2023-11-05 DIAGNOSIS — D485 Neoplasm of uncertain behavior of skin: Secondary | ICD-10-CM | POA: Diagnosis not present

## 2023-11-05 DIAGNOSIS — Z85828 Personal history of other malignant neoplasm of skin: Secondary | ICD-10-CM | POA: Diagnosis not present

## 2023-11-05 DIAGNOSIS — L080 Pyoderma: Secondary | ICD-10-CM | POA: Diagnosis not present

## 2023-11-21 ENCOUNTER — Encounter: Payer: Self-pay | Admitting: Family

## 2023-11-21 DIAGNOSIS — I10 Essential (primary) hypertension: Secondary | ICD-10-CM

## 2023-11-21 MED ORDER — CARVEDILOL 6.25 MG PO TABS
6.2500 mg | ORAL_TABLET | Freq: Two times a day (BID) | ORAL | 0 refills | Status: DC
Start: 2023-11-21 — End: 2023-12-07

## 2023-12-02 ENCOUNTER — Other Ambulatory Visit: Payer: Self-pay | Admitting: Family

## 2023-12-07 ENCOUNTER — Ambulatory Visit: Admitting: Family

## 2023-12-07 VITALS — BP 128/74 | HR 49 | Ht 71.0 in | Wt 163.0 lb

## 2023-12-07 DIAGNOSIS — Z125 Encounter for screening for malignant neoplasm of prostate: Secondary | ICD-10-CM

## 2023-12-07 DIAGNOSIS — Z23 Encounter for immunization: Secondary | ICD-10-CM

## 2023-12-07 DIAGNOSIS — Z9109 Other allergy status, other than to drugs and biological substances: Secondary | ICD-10-CM | POA: Diagnosis not present

## 2023-12-07 DIAGNOSIS — I10 Essential (primary) hypertension: Secondary | ICD-10-CM | POA: Diagnosis not present

## 2023-12-07 DIAGNOSIS — R739 Hyperglycemia, unspecified: Secondary | ICD-10-CM | POA: Diagnosis not present

## 2023-12-07 MED ORDER — CARVEDILOL 6.25 MG PO TABS: 6.2500 mg | ORAL_TABLET | Freq: Two times a day (BID) | ORAL | 1 refills | Status: AC

## 2023-12-07 MED ORDER — HYDRALAZINE HCL 25 MG PO TABS: 25.0000 mg | ORAL_TABLET | Freq: Three times a day (TID) | ORAL | Status: DC

## 2023-12-07 NOTE — Assessment & Plan Note (Signed)
 Stable on allegra, flonase and azelastine .  No longer taking singulair .

## 2023-12-07 NOTE — Progress Notes (Signed)
 Subjective:     Patient ID: Gregory Brennan, male    DOB: September 30, 1945, 78 y.o.   MRN: [id number]  Chief Complaint  Patient presents with   Follow-up    No concerns     HPI  Discussed the use of AI scribe software for clinical note transcription with the patient, who gave verbal consent to proceed.  History of Present Illness  Gregory Brennan is a 78 year old male with hypertension who presents for a routine follow-up visit.  He monitors his blood pressure at home, with readings typically around 128/74 mmHg and a pulse of 48-52 bpm. Blood pressure tends to rise during clinic visits. He takes amlodipine , carvedilol , and hydralazine  for hypertension, but only takes hydralazine  three times daily instead of four.  Allergies are manageable this year with Allegra, Zyrtec, Flonase, and azelastine  nasal sprays. Azelastine  is more effective than Flonase. He has discontinued Singulair  and is no longer receiving allergy shots.  He experienced a mechanical fall during a recent move, resulting in a tooth injury. He remains active but has decreased physical activity due to muscle strains. As a result he is slowing down in activities like pickleball.  He lives with his daughter Rolland, who decided to stay nearby to be closer to her mother in a care facility. Hillary helps care for her mother regularly. Lab Results  Component Value Date   PSA 0.78 05/05/2016        Health Maintenance Due  Topic Date Due   Medicare Annual Wellness (AWV)  05/02/2017   Influenza Vaccine  10/19/2023   COVID-19 Vaccine (10 - Pfizer risk 2024-25 season) 11/19/2023    Past Medical History:  Diagnosis Date   Allergy    Basal cell carcinoma    Cancer (HCC)    basal cell cancer- nose   Cataract    Heart murmur    'Possibly gone away- per pt   Hypertension    MRSA (methicillin resistant staph aureus) culture positive 2007   Rheumatic fever    age 65    Past Surgical History:  Procedure Laterality Date    ABDOMINAL HERNIA REPAIR  12/31/2011   COLONOSCOPY     MOHS SURGERY  2021   basal cell- nose   MOHS SURGERY  11/07/2022   left temple    Family History  Problem Relation Age of Onset   Emphysema Mother    Hypertension Father    Colon cancer Neg Hx    Colon polyps Neg Hx    Esophageal cancer Neg Hx    Rectal cancer Neg Hx    Stomach cancer Neg Hx     Social History   Socioeconomic History   Marital status: Married    Spouse name: Not on file   Number of children: Not on file   Years of education: Not on file   Highest education level: Master's degree (e.g., MA, MS, MEng, MEd, MSW, MBA)  Occupational History   Not on file  Tobacco Use   Smoking status: Never   Smokeless tobacco: Never  Vaping Use   Vaping status: Never Used  Substance and Sexual Activity   Alcohol use: No   Drug use: No   Sexual activity: Yes  Other Topics Concern   Not on file  Social History Narrative   M[place] from [place]    Daughter and son in law live in [place]   Has daughter in [place] articles/investing   Enjoys travelling   Worked as a  teacher/principal, 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, hospital life         Social Drivers of Corporate investment banker Strain: Low Risk  (12/07/2023)   Overall Financial Resource Strain (CARDIA)    Difficulty of Paying Living Expenses: Not hard at all  Food Insecurity: No Food Insecurity (12/07/2023)   Hunger Vital Sign    Worried About Running Out of Food in the Last Year: Never true    Ran Out of Food in the Last Year: Never true  Transportation Needs: No Transportation Needs (12/07/2023)   PRAPARE - Administrator, Civil Service (Medical): No    Lack of Transportation (Non-Medical): No  Physical Activity: Sufficiently Active (12/07/2023)   Exercise Vital Sign    Days of Exercise per Week: 5 days    Minutes of Exercise per Session: 70 min  Stress: No Stress Concern Present (12/07/2023)   [employer] of Occupational  Health - Occupational Stress Questionnaire    Feeling of Stress: Not at all  Social Connections: Unknown (12/07/2023)   Social Connection and Isolation Panel    Frequency of Communication with Friends and Family: Three times a week    Frequency of Social Gatherings with Friends and Family: More than three times a week    Attends Religious Services: More than 4 times per year    Active Member of [employer] or Organizations: Not on file    Attends Banker Meetings: Not on file    Marital Status: Married  Intimate Partner Violence: Unknown (06/24/2021)   Received from [facility]   HITS    Physically Hurt: Not on file    Insult or Talk Down To: Not on file    Threaten Physical Harm: Not on file    Scream or Curse: Not on file    Outpatient Medications Prior to Visit  Medication Sig Dispense Refill   amLODipine  (NORVASC ) 5 MG tablet TAKE 1 TABLET (5 MG TOTAL) BY MOUTH DAILY. 90 tablet 1   Azelastine  HCl 137 MCG/SPRAY SOLN PLACE 2 SPRAYS INTO BOTH NOSTRILS 2 (TWO) TIMES DAILY. USE IN EACH NOSTRIL AS DIRECTED 90 mL 1   fexofenadine (ALLEGRA) 180 MG tablet 1 tablet Swallow whole with water; do not take with fruit juices.     fluticasone (FLONASE) 50 MCG/ACT nasal spray Place 1 spray into both nostrils daily as needed.      carvedilol  (COREG ) 6.25 MG tablet Take 1 tablet (6.25 mg total) by mouth 2 (two) times daily with a meal. Needs appt 60 tablet 0   hydrALAZINE  (APRESOLINE ) 25 MG tablet Take 1 tablet (25 mg total) by mouth 4 (four) times daily. 360 tablet 1   montelukast  (SINGULAIR ) 10 MG tablet TAKE 1 TABLET BY MOUTH EVERYDAY AT BEDTIME (Patient not taking: Reported on 12/07/2023) 90 tablet 0   No facility-administered medications prior to visit.    Allergies  Allergen Reactions   Lisinopril      Stopped working   Penicillins Rash    ROS See HPI    Objective:    Physical Exam Constitutional:      General: He is not in acute distress.    Appearance: He is  well-developed.  HENT:     Head: Normocephalic and atraumatic.  Cardiovascular:     Rate and Rhythm: Normal rate and regular rhythm.     Heart sounds: No murmur heard. Pulmonary:     Effort: Pulmonary effort is normal. No respiratory distress.     Breath  sounds: Normal breath sounds. No wheezing or rales.  Skin:    General: Skin is warm and dry.  Neurological:     Mental Status: He is alert and oriented to person, place, and time.  Psychiatric:        Behavior: Behavior normal.        Thought Content: Thought content normal.      BP 128/74   Pulse (!) 49   Ht 5' 11 (1.803 m)   Wt 163 lb (73.9 kg)   SpO2 100%   BMI 22.73 kg/m  Wt Readings from Last 3 Encounters:  12/07/23 163 lb (73.9 kg)  10/17/23 162 lb (73.5 kg)  10/12/23 165 lb (74.8 kg)       Assessment & Plan:   Problem List Items Addressed This Visit       Unprioritized   Hyperglycemia   Last A1C was stable.       HTN (hypertension)   BP Readings from Last 3 Encounters:  12/07/23 (!) 141/80  10/17/23 (!) 127/59  10/12/23 (!) 162/95   128/74 this AM at home- better at home, continue carvedilol , amlodipine , hydralazine .       Relevant Medications   hydrALAZINE  (APRESOLINE ) 25 MG tablet   carvedilol  (COREG ) 6.25 MG tablet   Other Relevant Orders   Basic Metabolic Panel (BMET)   Environmental allergies   Stable on allegra, flonase and azelastine .  No longer taking singulair .       Other Visit Diagnoses       Screening for prostate cancer    -  Primary   Relevant Orders   PSA      Flu shot today  I have discontinued Danniel Dara's montelukast . I have also changed his hydrALAZINE . Additionally, I am having him maintain his fluticasone, fexofenadine, Azelastine  HCl, amLODipine , and carvedilol .  Meds ordered this encounter  Medications   hydrALAZINE  (APRESOLINE ) 25 MG tablet    Sig: Take 1 tablet (25 mg total) by mouth 3 (three) times daily.    X    Supervising Provider:   DOMENICA BLACKBIRD  A [[id number]]   carvedilol  (COREG ) 6.25 MG tablet    Sig: Take 1 tablet (6.25 mg total) by mouth 2 (two) times daily with a meal. Needs appt    Dispense:  180 tablet    Refill:  1    Supervising Provider:   DOMENICA BLACKBIRD A [[id number]]

## 2023-12-07 NOTE — Assessment & Plan Note (Signed)
 BP Readings from Last 3 Encounters:  12/07/23 (!) 141/80  10/17/23 (!) 127/59  10/12/23 (!) 162/95   128/74 this AM at home- better at home, continue carvedilol , amlodipine , hydralazine .

## 2023-12-07 NOTE — Patient Instructions (Signed)
 VISIT SUMMARY:  Today, you had a routine follow-up visit to check on your hypertension and allergies. Your blood pressure is well-controlled with your current medications, and your allergies are manageable with the treatments you're using. We also discussed general health maintenance, including the importance of getting your flu shot and COVID-19 booster.  YOUR PLAN:  HYPERTENSION: Your blood pressure is well-controlled with your current medications. Home readings are normal, and taking hydralazine  three times daily is acceptable. -Continue taking amlodipine , carvedilol , and hydralazine  as prescribed. -Your carvedilol  prescription has been refilled. -Monitor your blood pressure daily at home.  ALLERGIC RHINITIS: Your allergies are well-managed with your current treatments. Azelastine  nasal spray is more effective for you than Flonase. -Continue using Allegra, Zyrtec, Flonase, and azelastine  nasal spray as needed. -Do not refill Singulair  as it was not beneficial.  GENERAL HEALTH MAINTENANCE: We discussed the importance of getting your flu shot and COVID-19 booster due to your age and chronic conditions. -You received your flu shot today. -Please get your COVID-19 booster at the pharmacy.

## 2023-12-07 NOTE — Assessment & Plan Note (Signed)
 Last A1C was stable.

## 2023-12-08 ENCOUNTER — Ambulatory Visit: Payer: Self-pay | Admitting: Family

## 2023-12-08 LAB — BASIC METABOLIC PANEL WITH GFR
BUN: 12 mg/dL (ref 7–25)
CO2: 26 mmol/L (ref 20–32)
Calcium: 9.4 mg/dL (ref 8.6–10.3)
Chloride: 99 mmol/L (ref 98–110)
Creat: 0.9 mg/dL (ref 0.70–1.28)
Glucose, Bld: 105 mg/dL — ABNORMAL HIGH (ref 65–99)
Potassium: 4.8 mmol/L (ref 3.5–5.3)
Sodium: 135 mmol/L (ref 135–146)
eGFR: 89 mL/min/1.73m2 (ref >=60)

## 2023-12-08 LAB — PSA: PSA: 1.25 ng/mL (ref <=4.00)

## 2023-12-11 ENCOUNTER — Ambulatory Visit (INDEPENDENT_AMBULATORY_CARE_PROVIDER_SITE_OTHER)

## 2023-12-11 VITALS — Ht 71.0 in | Wt 163.0 lb

## 2023-12-11 DIAGNOSIS — Z0000 Encounter for general adult medical examination without abnormal findings: Secondary | ICD-10-CM

## 2023-12-11 NOTE — Progress Notes (Signed)
 Subjective:   Gregory Brennan is a 78 y.o. who presents for a Medicare Wellness preventive visit.  As a reminder, Annual Wellness Visits don't include a physical exam, and some assessments may be limited, especially if this visit is performed virtually. We may recommend an in-person follow-up visit with your provider if needed.  Visit Complete: Virtual I connected with  Gregory Brennan on 12/11/23 by a audio enabled telemedicine application and verified that I am speaking with the correct person using two identifiers.  Patient Location: Home  Provider Location: Home Office  I discussed the limitations of evaluation and management by telemedicine. The patient expressed understanding and agreed to proceed.  Vital Signs: Because this visit was a virtual/telehealth visit, some criteria may be missing or patient reported. Any vitals not documented were not able to be obtained and vitals that have been documented are patient reported.  VideoDeclined- This patient declined Librarian, academic. Therefore the visit was completed with audio only.  Persons Participating in Visit: Patient.  AWV Questionnaire: Yes: Patient Medicare AWV questionnaire was completed by the patient on 12/07/23; I have confirmed that all information answered by patient is correct and no changes since this date.  Cardiac Risk Factors include: advanced age (>38men, >55 women);male gender;hypertension     Objective:    Today's Vitals   12/11/23 1118  Weight: 163 lb (73.9 kg)  Height: 5' 11 (1.803 m)   Body mass index is 22.73 kg/m.     12/11/2023   11:22 AM 10/12/2023    8:40 PM 10/09/2020    9:15 AM 06/14/2016    1:20 PM 05/02/2016   10:55 AM  Advanced Directives  Does Patient Have a Medical Advance Directive? No Yes Yes Yes  Yes   Type of Special educational needs teacher of [place];Living will Healthcare Power of [place];Living will Healthcare Power of [place];Living will  Healthcare Power of [place];Living will  Copy of Healthcare Power of Attorney in Chart?   No - copy requested  No - copy requested   Would patient like information on creating a medical advance directive? Yes (MAU/Ambulatory/Procedural Areas - Information given)         Data saved with a previous flowsheet row definition    Current Medications (verified) Outpatient Encounter Medications as of 12/11/2023  Medication Sig   amLODipine  (NORVASC ) 5 MG tablet TAKE 1 TABLET (5 MG TOTAL) BY MOUTH DAILY.   Azelastine  HCl 137 MCG/SPRAY SOLN PLACE 2 SPRAYS INTO BOTH NOSTRILS 2 (TWO) TIMES DAILY. USE IN EACH NOSTRIL AS DIRECTED   carvedilol  (COREG ) 6.25 MG tablet Take 1 tablet (6.25 mg total) by mouth 2 (two) times daily with a meal. Needs appt   fexofenadine (ALLEGRA) 180 MG tablet 1 tablet Swallow whole with water; do not take with fruit juices.   fluticasone (FLONASE) 50 MCG/ACT nasal spray Place 1 spray into both nostrils daily as needed.    hydrALAZINE  (APRESOLINE ) 25 MG tablet Take 1 tablet (25 mg total) by mouth 3 (three) times daily.   No facility-administered encounter medications on file as of 12/11/2023.    Allergies (verified) Lisinopril  and Penicillins   History: Past Medical History:  Diagnosis Date   Allergy    Basal cell carcinoma    Cancer (HCC)    basal cell cancer- nose   Cataract    Heart murmur    'Possibly gone away- per pt   Hypertension    MRSA (methicillin resistant staph aureus) culture positive 2007   Rheumatic fever  age 87   Past Surgical History:  Procedure Laterality Date   ABDOMINAL HERNIA REPAIR  12/31/2011   COLONOSCOPY     HERNIA REPAIR     MOHS SURGERY  2021   basal cell- nose   MOHS SURGERY  11/07/2022   left temple   Family History  Problem Relation Age of Onset   Emphysema Mother    COPD Mother    Hypertension Father    Hearing loss Father    Colon cancer Neg Hx    Colon polyps Neg Hx    Esophageal cancer Neg Hx    Rectal cancer Neg  Hx    Stomach cancer Neg Hx    Social History   Socioeconomic History   Marital status: Married    Spouse name: Not on file   Number of children: Not on file   Years of education: Not on file   Highest education level: Master's degree (e.g., MA, MS, MEng, MEd, MSW, MBA)  Occupational History   Not on file  Tobacco Use   Smoking status: Never   Smokeless tobacco: Never  Vaping Use   Vaping status: Never Used  Substance and Sexual Activity   Alcohol use: No   Drug use: No   Sexual activity: Not Currently  Other Topics Concern   Not on file  Social History Narrative   Moved from [place]    Daughter and son in law live in [place]   Has daughter in [place] articles/investing   Enjoys travelling   Worked as a Theatre manager, 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, hospital life         Social Drivers of Corporate investment banker Strain: Low Risk  (12/07/2023)   Overall Financial Resource Strain (CARDIA)    Difficulty of Paying Living Expenses: Not hard at all  Food Insecurity: No Food Insecurity (12/07/2023)   Hunger Vital Sign    Worried About Running Out of Food in the Last Year: Never true    Ran Out of Food in the Last Year: Never true  Transportation Needs: No Transportation Needs (12/07/2023)   PRAPARE - Administrator, Civil Service (Medical): No    Lack of Transportation (Non-Medical): No  Physical Activity: Sufficiently Active (12/07/2023)   Exercise Vital Sign    Days of Exercise per Week: 5 days    Minutes of Exercise per Session: 70 min  Stress: No Stress Concern Present (12/07/2023)   [employer] of Occupational Health - Occupational Stress Questionnaire    Feeling of Stress: Not at all  Social Connections: Socially Integrated (12/07/2023)   Social Connection and Isolation Panel    Frequency of Communication with Friends and Family: Three times a week    Frequency of Social Gatherings with Friends and Family: More than three times a  week    Attends Religious Services: More than 4 times per year    Active Member of Clubs or Organizations: Yes    Attends Engineer, structural: More than 4 times per year    Marital Status: Married    Tobacco Counseling Counseling given: Not Answered    Clinical Intake:  Pre-visit preparation completed: Yes  Pain : No/denies pain     Diabetes: No  Lab Results  Component Value Date   HGBA1C 5.4 11/06/2022   HGBA1C 5.5 01/31/2022   HGBA1C 5.6 10/24/2017     How often do you need to have someone help you when you read  instructions, pamphlets, or other written materials from your doctor or pharmacy?: 1 - Never  Interpreter Needed?: No  Information entered by :: Charmaine Bloodgood LPN   Activities of Daily Living     12/07/2023   11:17 AM  In your present state of health, do you have any difficulty performing the following activities:  Hearing? 0  Vision? 0  Difficulty concentrating or making decisions? 0  Walking or climbing stairs? 0  Dressing or bathing? 0  Doing errands, shopping? 0  Preparing Food and eating ? N  Using the Toilet? N  In the past six months, have you accidently leaked urine? N  Do you have problems with loss of bowel control? N  Managing your Medications? N  Managing your Finances? N  Housekeeping or managing your Housekeeping? N    Patient Care Team: O'Sullivan, Melissa, NP as PCP - General (Internal Medicine)  I have updated your Care Teams any recent Medical Services you may have received from other providers in the past year.     Assessment:   This is a routine wellness examination for Gregory Brennan.  Hearing/Vision screen Hearing Screening - Comments:: Denies hearing difficulties   Vision Screening - Comments:: up to date with routine eye exams     Goals Addressed             This Visit's Progress    Maintain health and independence   On track      Depression Screen     12/11/2023   11:19 AM 12/07/2023    1:14 PM  09/15/2022    1:42 PM 01/31/2022   11:36 AM 04/27/2020    2:41 PM 10/24/2017   10:18 AM 03/07/2016    2:13 PM  PHQ 2/9 Scores  PHQ - 2 Score 0 0 0 0 0 0 0  PHQ- 9 Score 0 0 0   0     Fall Risk     12/07/2023    1:13 PM 12/07/2023   11:17 AM 09/15/2022    1:42 PM 01/31/2022   11:36 AM 04/27/2020    2:41 PM  Fall Risk   Falls in the past year? 1 1 0 1 0  Number falls in past yr: 0 0 0 0 0  Injury with Fall? 1 1 0 0 1  Risk for fall due to : History of fall(s) History of fall(s) No Fall Risks    Follow up Falls evaluation completed;Education provided Education provided;Falls prevention discussed;Falls evaluation completed Falls evaluation completed      MEDICARE RISK AT HOME:  Medicare Risk at Home Any stairs in or around the home?: (Patient-Rptd) Yes If so, are there any without handrails?: (Patient-Rptd) No Home free of loose throw rugs in walkways, pet beds, electrical cords, etc?: (Patient-Rptd) Yes Adequate lighting in your home to reduce risk of falls?: (Patient-Rptd) Yes Life alert?: (Patient-Rptd) Yes Use of a cane, walker or w/c?: (Patient-Rptd) No Grab bars in the bathroom?: (Patient-Rptd) No Shower chair or bench in shower?: (Patient-Rptd) Yes Elevated toilet seat or a handicapped toilet?: (Patient-Rptd) Yes  TIMED UP AND GO:  Was the test performed?  No  Cognitive Function: 6CIT completed        12/11/2023   11:23 AM  6CIT Screen  What Year? 0 points  What month? 0 points  What time? 0 points  Count back from 20 0 points  Months in reverse 0 points  Repeat phrase 0 points  Total Score 0 points    Immunizations Immunization  History  Administered Date(s) Administered   Fluad Quad(high Dose 65+) 12/03/2018, 01/10/2022   Hep A / Hep B 06/13/2019, 06/25/2019, 07/15/2019   INFLUENZA, HIGH DOSE SEASONAL PF 01/29/2015, 12/21/2017, 04/11/2019, 05/12/2020, 11/29/2020, 05/09/2021, 11/21/2022, 12/07/2023   Influenza,inj,Quad PF,6+ Mos 12/29/2013, 11/15/2015,  05/08/2022   Influenza-Unspecified 12/12/2016, 12/16/2019, 11/30/2020   Moderna Covid-19 Fall Seasonal Vaccine 73yrs & older 11/21/2022   PFIZER Comirnaty(Gray Top)Covid-19 Tri-Sucrose Vaccine 07/01/2020   PFIZER(Purple Top)SARS-COV-2 Vaccination 04/26/2019, 05/17/2019, 12/16/2019, 05/12/2020, 07/01/2020, 01/10/2022   Pfizer Covid-19 Vaccine Bivalent Booster 66yrs & up 11/29/2020   Pneumococcal Conjugate-13 01/26/2014   Pneumococcal Polysaccharide-23 01/29/2015, 08/04/2016, 04/11/2019, 05/12/2020, 05/09/2021, 05/08/2022   Tdap 03/20/2008, 05/27/2020, 10/12/2023   Unspecified SARS-COV-2 Vaccination 01/10/2022   Zoster Recombinant(Shingrix) 12/16/2017, 02/24/2018   Zoster, Live 01/29/2015    Screening Tests Health Maintenance  Topic Date Due   COVID-19 Vaccine (10 - Pfizer risk 2024-25 season) 11/19/2023   Colonoscopy  09/18/2024   Medicare Annual Wellness (AWV)  12/10/2024   DTaP/Tdap/Td (4 - Td or Tdap) 10/11/2033   Pneumococcal Vaccine: 50+ Years  Completed   Influenza Vaccine  Completed   Zoster Vaccines- Shingrix  Completed   HPV VACCINES  Aged Out   Meningococcal B Vaccine  Aged Out   Hepatitis B Vaccines 19-59 Average Risk  Discontinued   Hepatitis C Screening  Discontinued     Additional Screening:  Vision Screening: Recommended annual ophthalmology exams for early detection of glaucoma and other disorders of the eye. Is the patient up to date with their annual eye exam?  Yes  Who is the provider or what is the name of the office in which the patient attends annual eye exams?   Dental Screening: Recommended annual dental exams for proper oral hygiene  Community Resource Referral / Chronic Care Management: CRR required this visit?  No   CCM required this visit?  No   Plan:    I have personally reviewed and noted the following in the patient's chart:   Medical and social history Use of alcohol, tobacco or illicit drugs  Current medications and supplements  including opioid prescriptions. Patient is not currently taking opioid prescriptions. Functional ability and status Nutritional status Physical activity Advanced directives List of other physicians Hospitalizations, surgeries, and ER visits in previous 12 months Vitals Screenings to include cognitive, depression, and falls Referrals and appointments  In addition, I have reviewed and discussed with patient certain preventive protocols, quality metrics, and best practice recommendations. A written personalized care plan for preventive services as well as general preventive health recommendations were provided to patient.   Gregory Brennan, [place]   [id number]   After Visit Summary: (MyChart) Due to this being a telephonic visit, the after visit summary with patients personalized plan was offered to patient via MyChart   Notes: Nothing significant to report at this time.

## 2023-12-11 NOTE — Patient Instructions (Signed)
 Mr. Gallon,  Thank you for taking the time for your Medicare Wellness Visit. I appreciate your continued commitment to your health goals. Please review the care plan we discussed, and feel free to reach out if I can assist you further.  Medicare recommends these wellness visits once per year to help you and your care team stay ahead of potential health issues. These visits are designed to focus on prevention, allowing your provider to concentrate on managing your acute and chronic conditions during your regular appointments.  Please note that Annual Wellness Visits do not include a physical exam. Some assessments may be limited, especially if the visit was conducted virtually. If needed, we may recommend a separate in-person follow-up with your provider.  Ongoing Care Seeing your primary care provider every 3 to 6 months helps us  monitor your health and provide consistent, personalized care.   Referrals If a referral was made during today's visit and you haven't received any updates within two weeks, please contact the referred provider directly to check on the status.  Recommended Screenings:  Health Maintenance  Topic Date Due   COVID-19 Vaccine (10 - Pfizer risk 2024-25 season) 11/19/2023   Colon Cancer Screening  09/18/2024   Medicare Annual Wellness Visit  12/10/2024   DTaP/Tdap/Td vaccine (4 - Td or Tdap) 10/11/2033   Pneumococcal Vaccine for age over 69  Completed   Flu Shot  Completed   Zoster (Shingles) Vaccine  Completed   HPV Vaccine  Aged Out   Meningitis B Vaccine  Aged Out   Hepatitis B Vaccine  Discontinued   Hepatitis C Screening  Discontinued       12/11/2023   11:22 AM  Advanced Directives  Does Patient Have a Medical Advance Directive? No  Would patient like information on creating a medical advance directive? Yes (MAU/Ambulatory/Procedural Areas - Information given)   Advance Care Planning is important because it: Ensures you receive medical care that  aligns with your values, goals, and preferences. Provides guidance to your family and loved ones, reducing the emotional burden of decision-making during critical moments.  Information on Advanced Care Planning can be found at [place]  Secretary of [facility] Advance Health Care Directives Advance Health Care Directives ([email or web address]   Vision: Annual vision screenings are recommended for early detection of glaucoma, cataracts, and diabetic retinopathy. These exams can also reveal signs of chronic conditions such as diabetes and high blood pressure.  Dental: Annual dental screenings help detect early signs of oral cancer, gum disease, and other conditions linked to overall health, including heart disease and diabetes.  Please see the attached documents for additional preventive care recommendations.

## 2024-01-01 DIAGNOSIS — H43813 Vitreous degeneration, bilateral: Secondary | ICD-10-CM | POA: Diagnosis not present

## 2024-01-11 ENCOUNTER — Other Ambulatory Visit: Payer: Self-pay | Admitting: Family

## 2024-01-11 DIAGNOSIS — I10 Essential (primary) hypertension: Secondary | ICD-10-CM

## 2024-04-15 ENCOUNTER — Other Ambulatory Visit: Payer: Self-pay

## 2024-04-15 MED ORDER — AMLODIPINE BESYLATE 5 MG PO TABS: 5.0000 mg | ORAL_TABLET | Freq: Every day | ORAL | 1 refills | Status: AC
